# Patient Record
Sex: Female | Born: 1965 | Race: White | Hispanic: No | Marital: Married | Smoking: Never smoker
Health system: Southern US, Community
[De-identification: ages and names within clinical notes are randomized; demographics above are authoritative.]

## PROBLEM LIST (undated history)

## (undated) DIAGNOSIS — G43909 Migraine, unspecified, not intractable, without status migrainosus: Secondary | ICD-10-CM

## (undated) DIAGNOSIS — R42 Dizziness and giddiness: Secondary | ICD-10-CM

## (undated) DIAGNOSIS — E785 Hyperlipidemia, unspecified: Secondary | ICD-10-CM

## (undated) DIAGNOSIS — F419 Anxiety disorder, unspecified: Secondary | ICD-10-CM

## (undated) DIAGNOSIS — R002 Palpitations: Secondary | ICD-10-CM

## (undated) DIAGNOSIS — R634 Abnormal weight loss: Secondary | ICD-10-CM

## (undated) DIAGNOSIS — Z90710 Acquired absence of both cervix and uterus: Secondary | ICD-10-CM

## (undated) DIAGNOSIS — I10 Essential (primary) hypertension: Secondary | ICD-10-CM

## (undated) DIAGNOSIS — R000 Tachycardia, unspecified: Secondary | ICD-10-CM

## (undated) DIAGNOSIS — K620 Anal polyp: Secondary | ICD-10-CM

## (undated) DIAGNOSIS — K621 Rectal polyp: Secondary | ICD-10-CM

## (undated) DIAGNOSIS — K219 Gastro-esophageal reflux disease without esophagitis: Secondary | ICD-10-CM

## (undated) HISTORY — DX: Rectal polyp: K62.1

## (undated) HISTORY — DX: Palpitations: R00.2

## (undated) HISTORY — DX: Essential (primary) hypertension: I10

## (undated) HISTORY — DX: Acquired absence of both cervix and uterus: Z90.710

## (undated) HISTORY — DX: Dizziness and giddiness: R42

## (undated) HISTORY — DX: Anxiety disorder, unspecified: F41.9

## (undated) HISTORY — DX: Migraine, unspecified, not intractable, without status migrainosus: G43.909

## (undated) HISTORY — DX: Tachycardia, unspecified: R00.0

## (undated) HISTORY — DX: Anal polyp: K62.0

## (undated) HISTORY — DX: Gastro-esophageal reflux disease without esophagitis: K21.9

## (undated) HISTORY — DX: Abnormal weight loss: R63.4

## (undated) HISTORY — DX: Hyperlipidemia, unspecified: E78.5

---

## 1989-12-29 HISTORY — PX: HERNIA REPAIR: SHX51

## 1989-12-29 HISTORY — PX: OOPHORECTOMY: SHX86

## 2000-12-29 HISTORY — PX: PERINEAL BODY REPAIR: SHX2216

## 2001-10-29 HISTORY — PX: VAGINAL HYSTERECTOMY: SHX2639

## 2003-12-30 HISTORY — PX: INGUINAL HERNIA REPAIR: SHX194

## 2006-03-18 ENCOUNTER — Encounter: Payer: Self-pay | Admitting: Family Medicine

## 2006-08-17 ENCOUNTER — Ambulatory Visit: Payer: Self-pay | Admitting: Family Medicine

## 2006-08-24 ENCOUNTER — Ambulatory Visit: Payer: Self-pay | Admitting: Family Medicine

## 2006-09-04 ENCOUNTER — Encounter: Payer: Self-pay | Admitting: Cardiology

## 2006-09-04 ENCOUNTER — Ambulatory Visit: Payer: Self-pay

## 2007-02-01 ENCOUNTER — Ambulatory Visit: Payer: Self-pay | Admitting: Family Medicine

## 2007-02-15 ENCOUNTER — Ambulatory Visit: Payer: Self-pay | Admitting: Family Medicine

## 2007-02-23 ENCOUNTER — Ambulatory Visit: Payer: Self-pay | Admitting: Family Medicine

## 2007-02-23 LAB — CONVERTED CEMR LAB: Glucose, Fasting: 108 mg/dL — ABNORMAL HIGH (ref 70–99)

## 2007-02-25 ENCOUNTER — Ambulatory Visit: Payer: Self-pay | Admitting: Family Medicine

## 2007-03-05 ENCOUNTER — Encounter: Admission: RE | Admit: 2007-03-05 | Discharge: 2007-06-03 | Payer: Self-pay | Admitting: Family Medicine

## 2007-06-02 ENCOUNTER — Ambulatory Visit: Payer: Self-pay | Admitting: Family Medicine

## 2007-06-02 LAB — CONVERTED CEMR LAB
Cholesterol: 176 mg/dL (ref 0–200)
HDL: 31.3 mg/dL — ABNORMAL LOW (ref >39.0)
LDL Cholesterol: 132 mg/dL — ABNORMAL HIGH (ref 0–99)
Total CHOL/HDL Ratio: 5.6
VLDL: 12 mg/dL (ref 0–40)

## 2007-06-18 ENCOUNTER — Ambulatory Visit: Payer: Self-pay | Admitting: Family Medicine

## 2007-06-18 DIAGNOSIS — N816 Rectocele: Secondary | ICD-10-CM

## 2007-06-18 DIAGNOSIS — R002 Palpitations: Secondary | ICD-10-CM

## 2007-06-18 DIAGNOSIS — N8112 Cystocele, lateral: Secondary | ICD-10-CM | POA: Insufficient documentation

## 2007-06-18 DIAGNOSIS — G43909 Migraine, unspecified, not intractable, without status migrainosus: Secondary | ICD-10-CM | POA: Insufficient documentation

## 2007-06-18 DIAGNOSIS — N814 Uterovaginal prolapse, unspecified: Secondary | ICD-10-CM | POA: Insufficient documentation

## 2007-07-05 ENCOUNTER — Ambulatory Visit: Payer: Self-pay | Admitting: Family Medicine

## 2007-07-22 ENCOUNTER — Telehealth: Payer: Self-pay | Admitting: Family Medicine

## 2007-07-26 ENCOUNTER — Telehealth (INDEPENDENT_AMBULATORY_CARE_PROVIDER_SITE_OTHER): Payer: Self-pay | Admitting: *Deleted

## 2007-08-05 ENCOUNTER — Telehealth (INDEPENDENT_AMBULATORY_CARE_PROVIDER_SITE_OTHER): Payer: Self-pay | Admitting: *Deleted

## 2007-08-09 ENCOUNTER — Ambulatory Visit: Payer: Self-pay | Admitting: Family Medicine

## 2007-08-09 DIAGNOSIS — H9319 Tinnitus, unspecified ear: Secondary | ICD-10-CM | POA: Insufficient documentation

## 2007-09-22 ENCOUNTER — Ambulatory Visit: Payer: Self-pay | Admitting: Family Medicine

## 2007-09-22 LAB — CONVERTED CEMR LAB
Bilirubin Urine: NEGATIVE
Blood in Urine, dipstick: NEGATIVE
Ketones, urine, test strip: NEGATIVE
Nitrite: NEGATIVE
Protein, U semiquant: NEGATIVE
Urobilinogen, UA: 0.2

## 2007-09-24 LAB — CONVERTED CEMR LAB
ALT: 25 units/L (ref 0–35)
Albumin: 3.9 g/dL (ref 3.5–5.2)
Alkaline Phosphatase: 51 units/L (ref 39–117)
BUN: 15 mg/dL (ref 6–23)
Basophils Absolute: 0 10*3/uL (ref 0.0–0.1)
Calcium: 9.6 mg/dL (ref 8.4–10.5)
Cholesterol: 147 mg/dL (ref 0–200)
Creatinine, Ser: 0.8 mg/dL (ref 0.4–1.2)
GFR calc Af Amer: 102 mL/min
HDL: 33.8 mg/dL — ABNORMAL LOW (ref >39.0)
Hemoglobin: 13.2 g/dL (ref 12.0–15.0)
LDL Cholesterol: 104 mg/dL — ABNORMAL HIGH (ref 0–99)
MCHC: 33.7 g/dL (ref 30.0–36.0)
Microalb Creat Ratio: 4.7 mg/g (ref 0.0–30.0)
Microalb, Ur: 0.3 mg/dL (ref 0.0–1.9)
Monocytes Absolute: 0.4 10*3/uL (ref 0.2–0.7)
Monocytes Relative: 6.9 % (ref 3.0–11.0)
Platelets: 269 10*3/uL (ref 150–400)
Potassium: 4.9 meq/L (ref 3.5–5.1)
RDW: 12.9 % (ref 11.5–14.6)
TSH: 1.62 microintl units/mL (ref 0.35–5.50)
Total Bilirubin: 0.6 mg/dL (ref 0.3–1.2)
Total CHOL/HDL Ratio: 4.3
Triglycerides: 46 mg/dL (ref 0–149)
VLDL: 9 mg/dL (ref 0–40)

## 2007-10-06 ENCOUNTER — Telehealth: Payer: Self-pay | Admitting: Family Medicine

## 2007-10-07 ENCOUNTER — Telehealth: Payer: Self-pay | Admitting: Family Medicine

## 2008-01-03 ENCOUNTER — Telehealth: Payer: Self-pay | Admitting: Family Medicine

## 2008-01-05 ENCOUNTER — Ambulatory Visit: Payer: Self-pay | Admitting: Family Medicine

## 2008-01-05 DIAGNOSIS — E785 Hyperlipidemia, unspecified: Secondary | ICD-10-CM | POA: Insufficient documentation

## 2008-01-05 DIAGNOSIS — R51 Headache: Secondary | ICD-10-CM

## 2008-01-05 DIAGNOSIS — F411 Generalized anxiety disorder: Secondary | ICD-10-CM | POA: Insufficient documentation

## 2008-01-05 DIAGNOSIS — R519 Headache, unspecified: Secondary | ICD-10-CM | POA: Insufficient documentation

## 2008-01-05 DIAGNOSIS — I10 Essential (primary) hypertension: Secondary | ICD-10-CM | POA: Insufficient documentation

## 2008-01-05 DIAGNOSIS — R42 Dizziness and giddiness: Secondary | ICD-10-CM

## 2008-01-05 DIAGNOSIS — E119 Type 2 diabetes mellitus without complications: Secondary | ICD-10-CM

## 2008-01-07 ENCOUNTER — Telehealth: Payer: Self-pay | Admitting: Family Medicine

## 2008-01-07 LAB — CONVERTED CEMR LAB
Albumin: 4.2 g/dL (ref 3.5–5.2)
Basophils Absolute: 0 10*3/uL (ref 0.0–0.1)
Cholesterol: 149 mg/dL (ref 0–200)
Eosinophils Absolute: 0.1 10*3/uL (ref 0.0–0.6)
GFR calc non Af Amer: 84 mL/min
HCT: 39.5 % (ref 36.0–46.0)
Hemoglobin: 14.1 g/dL (ref 12.0–15.0)
Hgb A1c MFr Bld: 5.9 % (ref 4.6–6.0)
MCHC: 35.6 g/dL (ref 30.0–36.0)
MCV: 84.7 fL (ref 78.0–100.0)
Monocytes Absolute: 0.4 10*3/uL (ref 0.2–0.7)
Neutro Abs: 3.7 10*3/uL (ref 1.4–7.7)
Neutrophils Relative %: 63.1 % (ref 43.0–77.0)
Potassium: 4.8 meq/L (ref 3.5–5.1)
Sodium: 140 meq/L (ref 135–145)
TSH: 1.82 microintl units/mL (ref 0.35–5.50)
Total Bilirubin: 0.8 mg/dL (ref 0.3–1.2)

## 2008-01-13 ENCOUNTER — Emergency Department (HOSPITAL_COMMUNITY): Admission: EM | Admit: 2008-01-13 | Discharge: 2008-01-13 | Payer: Self-pay | Admitting: Emergency Medicine

## 2008-01-14 ENCOUNTER — Ambulatory Visit: Payer: Self-pay | Admitting: Family Medicine

## 2008-01-14 DIAGNOSIS — R000 Tachycardia, unspecified: Secondary | ICD-10-CM | POA: Insufficient documentation

## 2008-01-26 ENCOUNTER — Ambulatory Visit: Payer: Self-pay | Admitting: Cardiovascular Disease

## 2008-01-28 ENCOUNTER — Ambulatory Visit: Payer: Self-pay | Admitting: Cardiovascular Disease

## 2008-03-08 ENCOUNTER — Ambulatory Visit: Payer: Self-pay | Admitting: Cardiovascular Disease

## 2008-03-14 ENCOUNTER — Ambulatory Visit: Payer: Self-pay | Admitting: Family Medicine

## 2008-06-05 ENCOUNTER — Ambulatory Visit: Payer: Self-pay | Admitting: Cardiovascular Disease

## 2008-06-05 ENCOUNTER — Telehealth: Payer: Self-pay | Admitting: Family Medicine

## 2008-06-07 ENCOUNTER — Ambulatory Visit: Payer: Self-pay | Admitting: Family Medicine

## 2008-06-08 LAB — CONVERTED CEMR LAB
Albumin: 3.9 g/dL (ref 3.5–5.2)
BUN: 18 mg/dL (ref 6–23)
Basophils Relative: 0.9 % (ref 0.0–1.0)
Calcium: 9.4 mg/dL (ref 8.4–10.5)
Creatinine, Ser: 0.8 mg/dL (ref 0.4–1.2)
Creatinine,U: 54.9 mg/dL
Eosinophils Absolute: 0.1 10*3/uL (ref 0.0–0.7)
Eosinophils Relative: 1.8 % (ref 0.0–5.0)
GFR calc Af Amer: 101 mL/min
GFR calc non Af Amer: 84 mL/min
HCT: 39.8 % (ref 36.0–46.0)
HDL: 37.7 mg/dL — ABNORMAL LOW (ref >39.0)
Hgb A1c MFr Bld: 5.9 % (ref 4.6–6.0)
MCV: 88.3 fL (ref 78.0–100.0)
Microalb Creat Ratio: 3.6 mg/g (ref 0.0–30.0)
Microalb, Ur: 0.2 mg/dL (ref 0.0–1.9)
Monocytes Absolute: 0.3 10*3/uL (ref 0.1–1.0)
Platelets: 240 10*3/uL (ref 150–400)
Potassium: 4.1 meq/L (ref 3.5–5.1)
WBC: 5.1 10*3/uL (ref 4.5–10.5)

## 2008-12-18 ENCOUNTER — Telehealth: Payer: Self-pay | Admitting: Family Medicine

## 2009-01-09 ENCOUNTER — Telehealth: Payer: Self-pay | Admitting: Family Medicine

## 2009-01-13 ENCOUNTER — Emergency Department (HOSPITAL_BASED_OUTPATIENT_CLINIC_OR_DEPARTMENT_OTHER): Admission: EM | Admit: 2009-01-13 | Discharge: 2009-01-14 | Payer: Self-pay | Admitting: Emergency Medicine

## 2009-01-14 ENCOUNTER — Ambulatory Visit: Payer: Self-pay | Admitting: Diagnostic Radiology

## 2009-01-15 ENCOUNTER — Ambulatory Visit: Payer: Self-pay | Admitting: Family Medicine

## 2009-01-17 ENCOUNTER — Telehealth: Payer: Self-pay | Admitting: Family Medicine

## 2009-01-17 LAB — CONVERTED CEMR LAB
AST: 19 units/L (ref 0–37)
Alkaline Phosphatase: 45 units/L (ref 39–117)
Total Bilirubin: 0.7 mg/dL (ref 0.3–1.2)
Total CHOL/HDL Ratio: 3.6

## 2009-02-14 ENCOUNTER — Telehealth: Payer: Self-pay | Admitting: Family Medicine

## 2009-06-05 ENCOUNTER — Telehealth: Payer: Self-pay | Admitting: Cardiovascular Disease

## 2009-06-22 ENCOUNTER — Emergency Department (HOSPITAL_BASED_OUTPATIENT_CLINIC_OR_DEPARTMENT_OTHER): Admission: EM | Admit: 2009-06-22 | Discharge: 2009-06-22 | Payer: Self-pay | Admitting: Emergency Medicine

## 2009-06-24 IMAGING — CT CT HEAD W/O CM
1 series · 16 of 30 positions shown, 20 images · non-contrast
Comparison: None

CLINICAL DATA: Weakness and lightheadedness.

CT HEAD WITHOUT CONTRAST
TECHNIQUE: Contiguous axial images were obtained from the base of
the skull through the vertex without contrast.

[Series 2: head 4.8 h37s · axial · 0.40mm/px · z∈[-165,-32]mm · 16 of 32 slices shown, 20 images]
[im 2/32  brain]
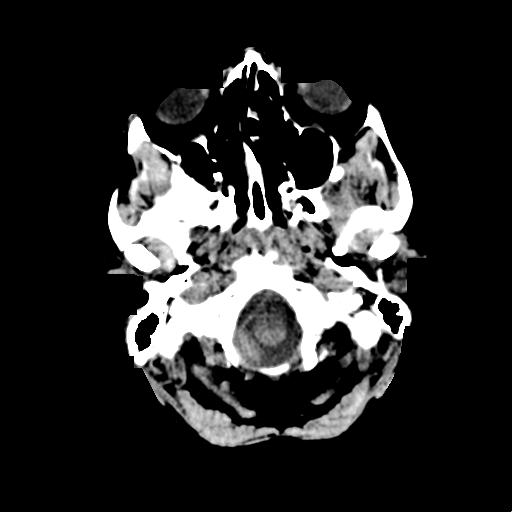
[im 2/32  bone]
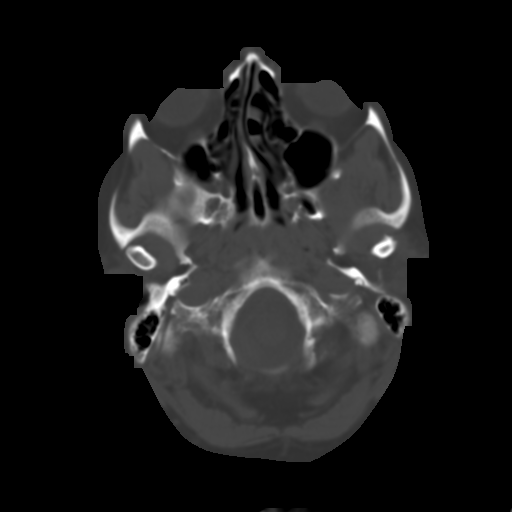
[im 4/32  brain]
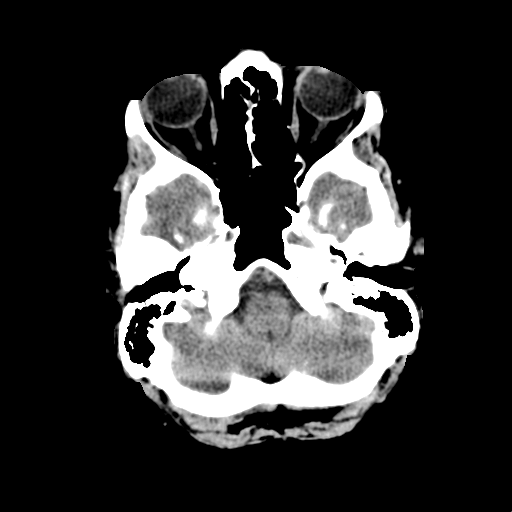
[im 6/32  brain]
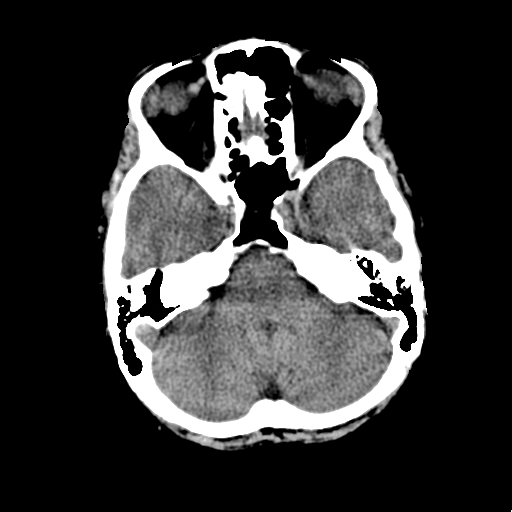
[im 8/32  brain]
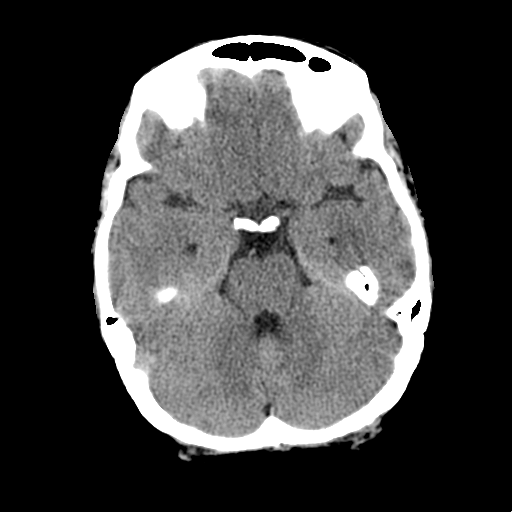
[im 9/32  brain]
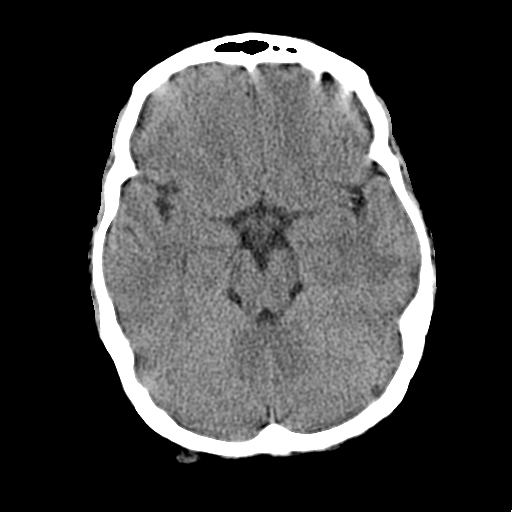
[im 9/32  bone]
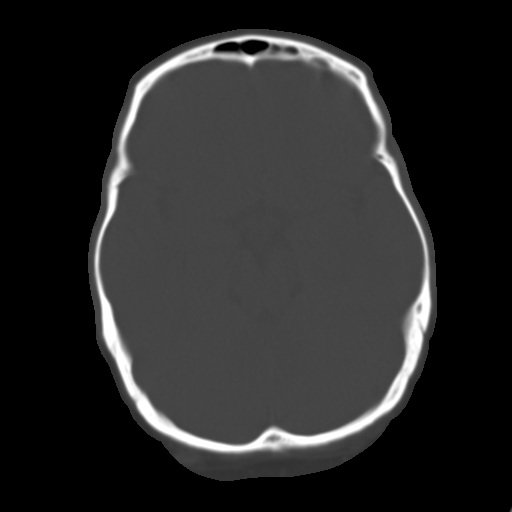
[im 11/32  brain]
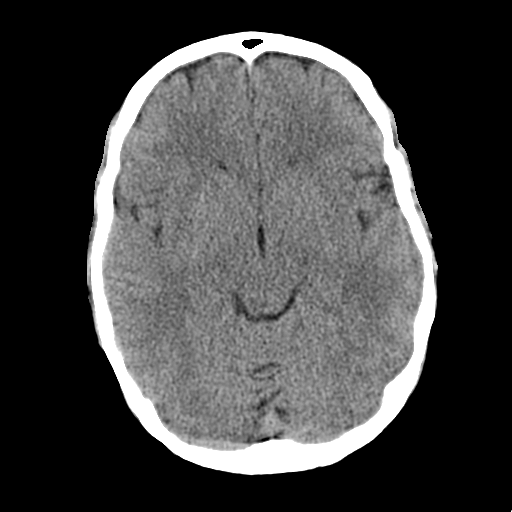
[im 13/32  brain]
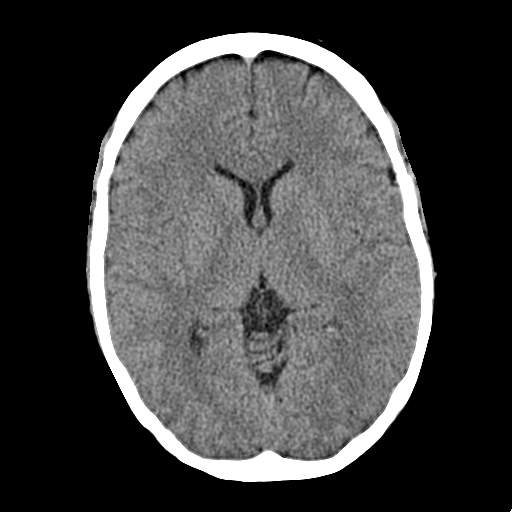
[im 15/32  brain]
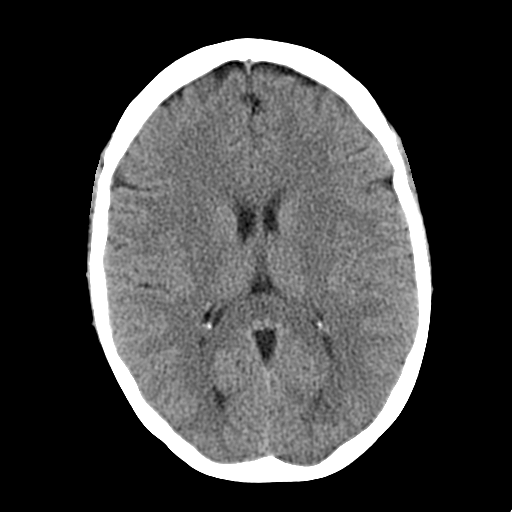
[im 17/32  brain]
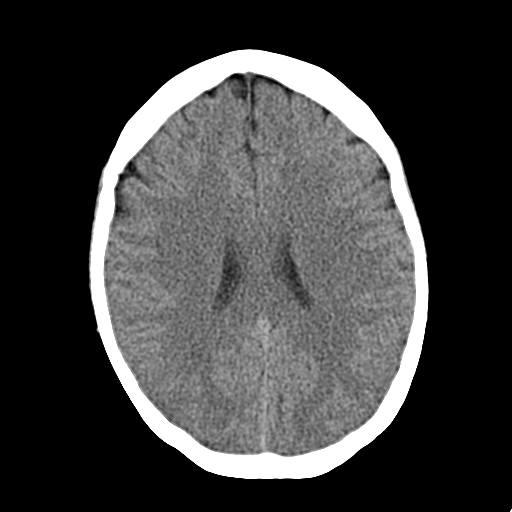
[im 17/32  bone]
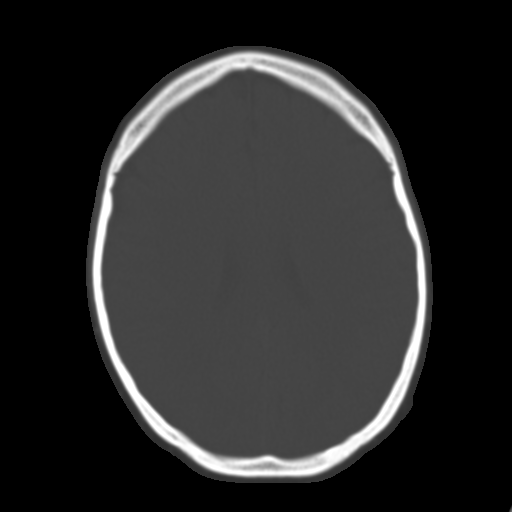
[im 19/32  brain]
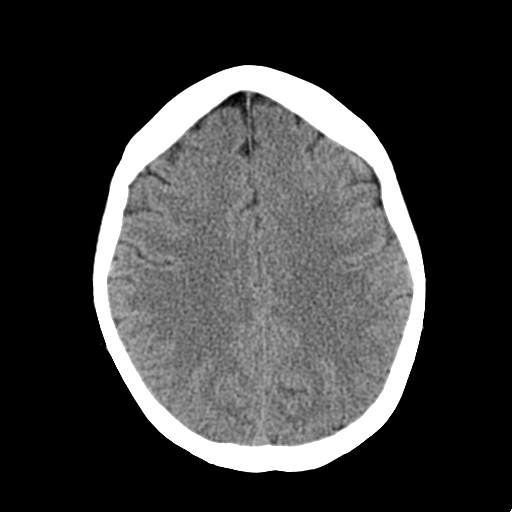
[im 21/32  brain]
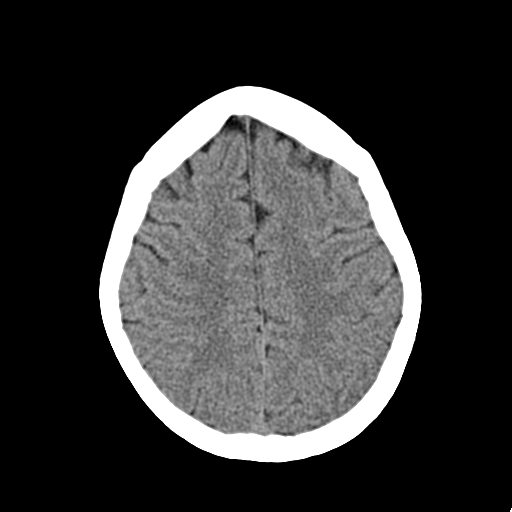
[im 23/32  brain]
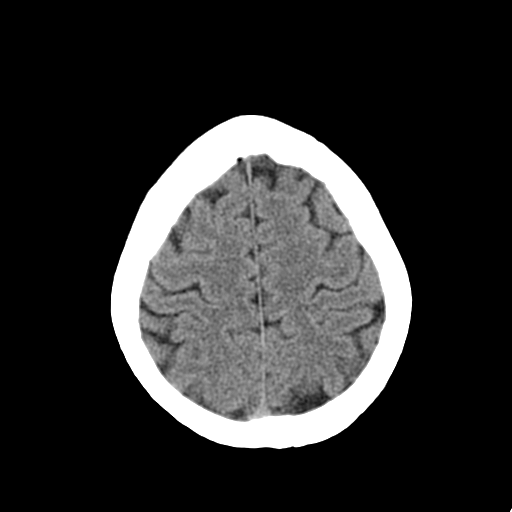
[im 24/32  brain]
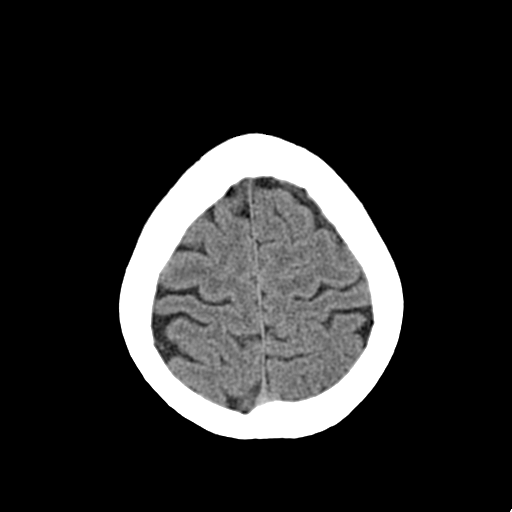
[im 24/32  bone]
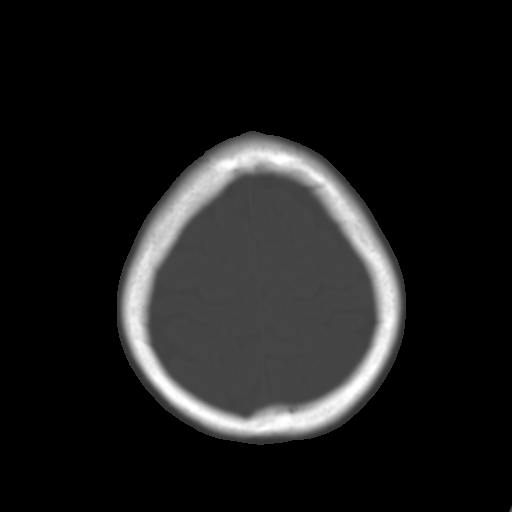
[im 26/32  brain]
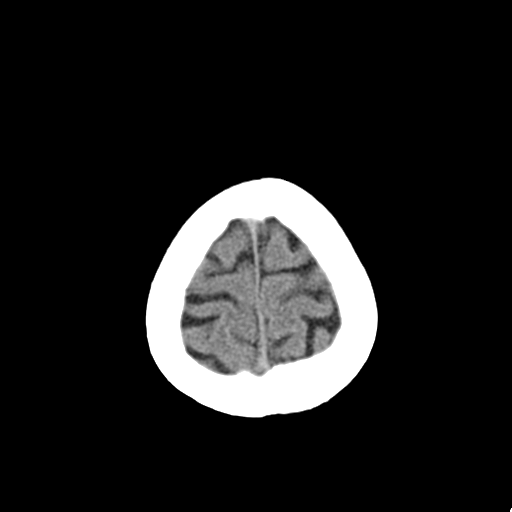
[im 28/32  brain]
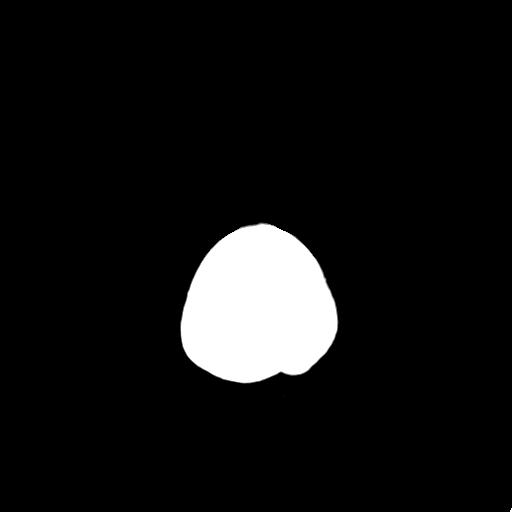
[im 30/32  brain]
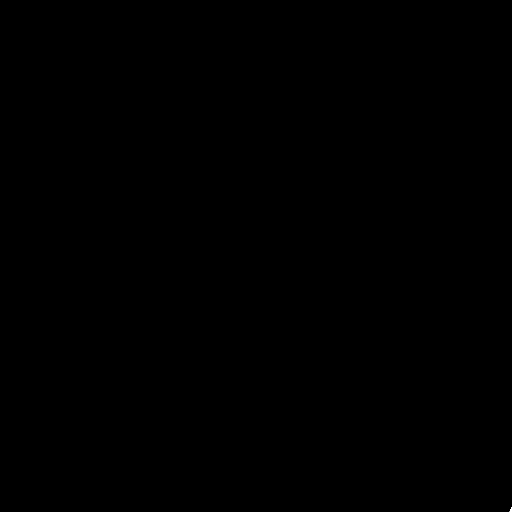

[16 of 30 positions shown; findings below may reference images not displayed]

FINDINGS: There is no evidence of acute intracranial hemorrhage,
mass lesion, brain edema or extra-axial fluid collection.  The
ventricles and subarachnoid spaces are appropriately sized for age.
There is no CT evidence of acute infarction.

The visualized paranasal sinuses are clear.  There is a concha
bullosa on the left.  The calvarium is intact.
IMPRESSION: No acute intracranial findings.

## 2009-06-24 IMAGING — CR DG CHEST 2V
2 series · 2 of 2 positions shown · non-contrast
Comparison: None

CLINICAL DATA: Dizziness and cough.

CHEST - 2 VIEW

[w chest pa]
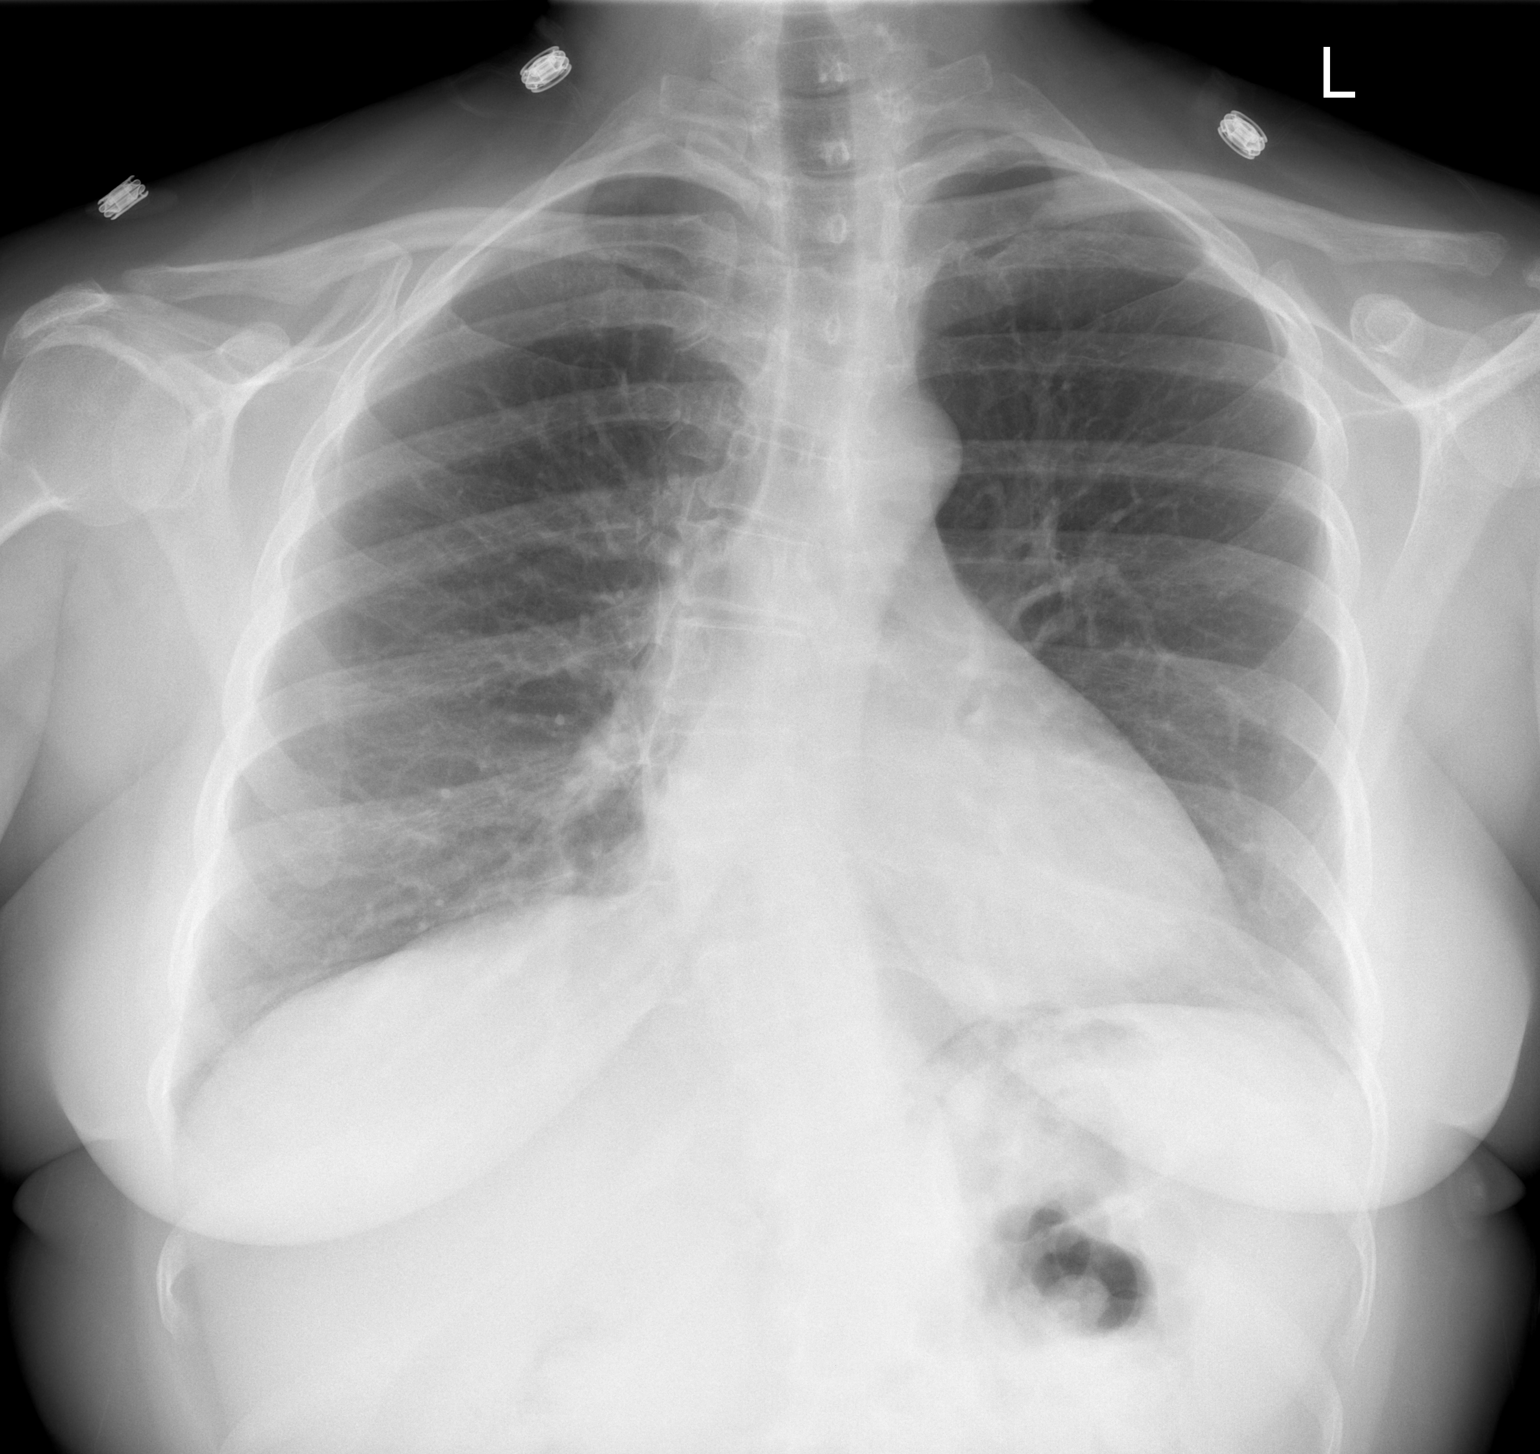

[w chest lat]
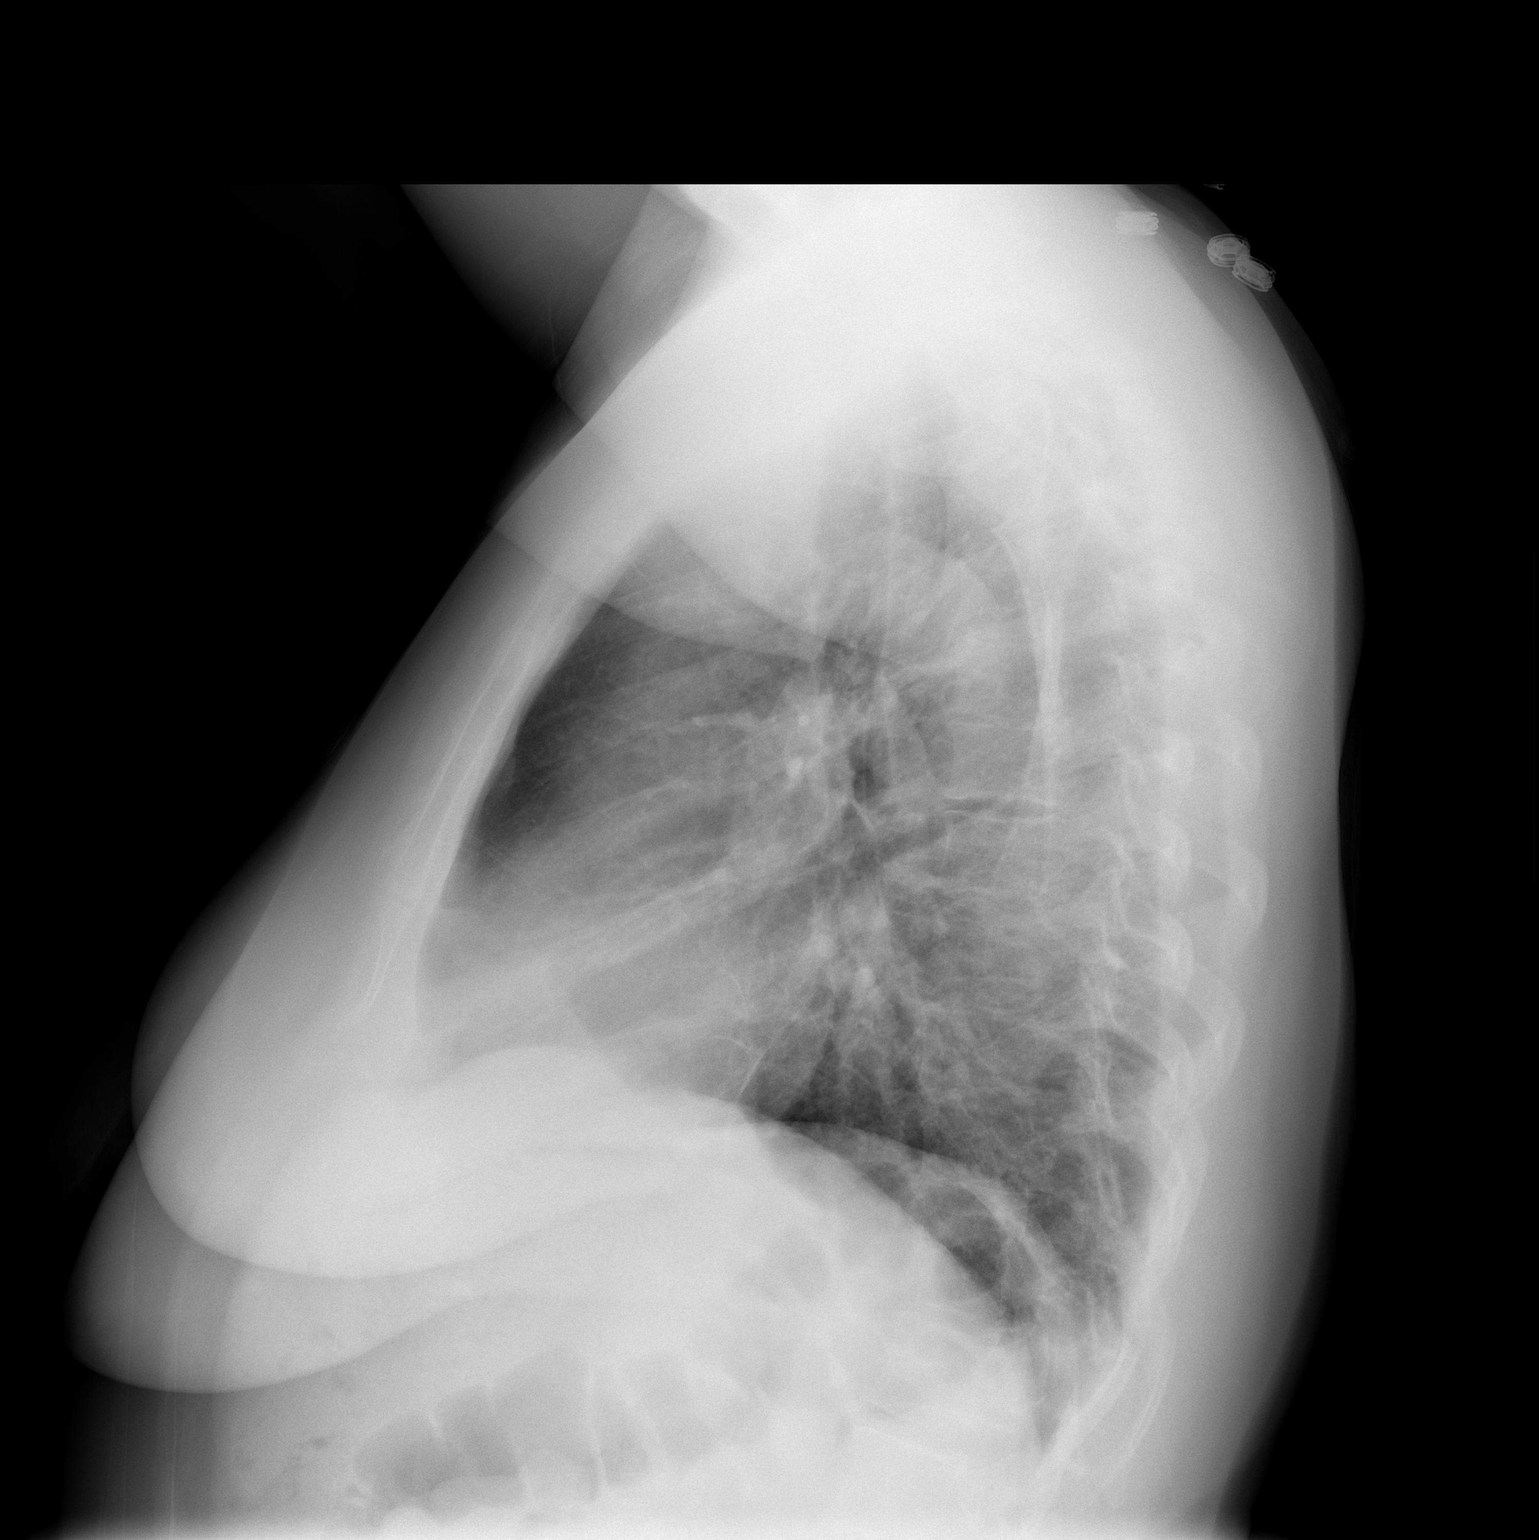

[2 of 2 positions shown; findings below may reference images not displayed]

FINDINGS: There is a convex right thoracic scoliosis.  Allowing for
this, the heart size and mediastinal contours are normal.  The
lungs are clear.  There is no pleural effusion or pneumothorax.
IMPRESSION: No active cardiopulmonary process.  Scoliosis.

## 2009-08-23 ENCOUNTER — Telehealth: Payer: Self-pay | Admitting: Family Medicine

## 2009-10-04 ENCOUNTER — Ambulatory Visit: Payer: Self-pay | Admitting: Family Medicine

## 2009-10-10 LAB — CONVERTED CEMR LAB
AST: 13 units/L (ref 0–37)
Albumin: 3.9 g/dL (ref 3.5–5.2)
Alkaline Phosphatase: 50 units/L (ref 39–117)
Basophils Absolute: 0 10*3/uL (ref 0.0–0.1)
Bilirubin, Direct: 0.1 mg/dL (ref 0.0–0.3)
Calcium: 9.3 mg/dL (ref 8.4–10.5)
Creatinine, Ser: 0.8 mg/dL (ref 0.4–1.2)
Eosinophils Absolute: 0.1 10*3/uL (ref 0.0–0.7)
GFR calc non Af Amer: 83.04 mL/min (ref >60)
Glucose, Bld: 96 mg/dL (ref 70–99)
HDL: 36 mg/dL — ABNORMAL LOW (ref >39.00)
Hemoglobin: 14.2 g/dL (ref 12.0–15.0)
Ketones, ur: NEGATIVE mg/dL
Lymphocytes Relative: 26.2 % (ref 12.0–46.0)
Monocytes Relative: 7.6 % (ref 3.0–12.0)
Neutro Abs: 4.2 10*3/uL (ref 1.4–7.7)
Neutrophils Relative %: 64.1 % (ref 43.0–77.0)
RDW: 13.1 % (ref 11.5–14.6)
Sodium: 135 meq/L (ref 135–145)
Specific Gravity, Urine: 1.02 (ref 1.000–1.030)
Total Bilirubin: 0.6 mg/dL (ref 0.3–1.2)
Total CHOL/HDL Ratio: 3
Total Protein, Urine: NEGATIVE mg/dL
Triglycerides: 45 mg/dL (ref 0.0–149.0)
Urine Glucose: NEGATIVE mg/dL
VLDL: 9 mg/dL (ref 0.0–40.0)
pH: 5.5 (ref 5.0–8.0)

## 2009-10-12 LAB — CONVERTED CEMR LAB: Hgb A1c MFr Bld: 5.8 % (ref 4.6–6.5)

## 2009-10-15 ENCOUNTER — Ambulatory Visit: Payer: Self-pay | Admitting: Family Medicine

## 2009-10-15 DIAGNOSIS — K620 Anal polyp: Secondary | ICD-10-CM | POA: Insufficient documentation

## 2009-10-15 DIAGNOSIS — R634 Abnormal weight loss: Secondary | ICD-10-CM

## 2009-10-15 DIAGNOSIS — K621 Rectal polyp: Secondary | ICD-10-CM

## 2009-10-15 DIAGNOSIS — K6289 Other specified diseases of anus and rectum: Secondary | ICD-10-CM

## 2009-10-17 ENCOUNTER — Telehealth: Payer: Self-pay | Admitting: *Deleted

## 2009-11-19 ENCOUNTER — Encounter: Admission: RE | Admit: 2009-11-19 | Discharge: 2009-12-26 | Payer: Self-pay | Admitting: Family Medicine

## 2009-11-19 ENCOUNTER — Encounter: Payer: Self-pay | Admitting: Family Medicine

## 2009-11-20 ENCOUNTER — Ambulatory Visit: Payer: Self-pay | Admitting: Gastroenterology

## 2009-11-30 ENCOUNTER — Ambulatory Visit: Payer: Self-pay | Admitting: Gastroenterology

## 2009-12-31 ENCOUNTER — Telehealth: Payer: Self-pay | Admitting: Family Medicine

## 2010-01-22 ENCOUNTER — Telehealth: Payer: Self-pay | Admitting: *Deleted

## 2010-03-28 ENCOUNTER — Telehealth: Payer: Self-pay | Admitting: Family Medicine

## 2010-06-10 ENCOUNTER — Telehealth: Payer: Self-pay | Admitting: Family Medicine

## 2010-06-19 ENCOUNTER — Ambulatory Visit: Payer: Self-pay | Admitting: Family Medicine

## 2010-06-19 DIAGNOSIS — M779 Enthesopathy, unspecified: Secondary | ICD-10-CM | POA: Insufficient documentation

## 2010-07-22 ENCOUNTER — Encounter: Admission: RE | Admit: 2010-07-22 | Discharge: 2010-07-22 | Payer: Self-pay | Admitting: Family Medicine

## 2010-08-19 ENCOUNTER — Ambulatory Visit: Payer: Self-pay | Admitting: Family Medicine

## 2010-08-19 DIAGNOSIS — M722 Plantar fascial fibromatosis: Secondary | ICD-10-CM

## 2010-08-22 LAB — CONVERTED CEMR LAB
Hgb A1c MFr Bld: 6 % (ref 4.6–6.5)
TSH: 1.64 microintl units/mL (ref 0.35–5.50)

## 2010-08-28 ENCOUNTER — Encounter: Payer: Self-pay | Admitting: Family Medicine

## 2010-10-01 ENCOUNTER — Encounter: Payer: Self-pay | Admitting: Family Medicine

## 2010-10-18 ENCOUNTER — Telehealth: Payer: Self-pay | Admitting: Family Medicine

## 2011-01-20 DIAGNOSIS — Z8679 Personal history of other diseases of the circulatory system: Secondary | ICD-10-CM | POA: Insufficient documentation

## 2011-01-20 DIAGNOSIS — K5289 Other specified noninfective gastroenteritis and colitis: Secondary | ICD-10-CM | POA: Insufficient documentation

## 2011-01-20 DIAGNOSIS — H8090 Unspecified otosclerosis, unspecified ear: Secondary | ICD-10-CM | POA: Insufficient documentation

## 2011-01-20 DIAGNOSIS — K219 Gastro-esophageal reflux disease without esophagitis: Secondary | ICD-10-CM | POA: Insufficient documentation

## 2011-01-20 DIAGNOSIS — E7800 Pure hypercholesterolemia, unspecified: Secondary | ICD-10-CM | POA: Insufficient documentation

## 2011-01-22 ENCOUNTER — Encounter: Payer: Self-pay | Admitting: Cardiovascular Disease

## 2011-01-22 ENCOUNTER — Ambulatory Visit
Admission: RE | Admit: 2011-01-22 | Discharge: 2011-01-22 | Payer: Self-pay | Source: Home / Self Care | Attending: Cardiovascular Disease | Admitting: Cardiovascular Disease

## 2011-01-23 ENCOUNTER — Emergency Department (HOSPITAL_COMMUNITY)
Admission: EM | Admit: 2011-01-23 | Discharge: 2011-01-23 | Payer: Self-pay | Source: Home / Self Care | Admitting: Emergency Medicine

## 2011-01-23 LAB — POCT CARDIAC MARKERS
Myoglobin, poc: 36.9 ng/mL (ref 12–200)
Myoglobin, poc: 35.6 ng/mL (ref 12–200)
Troponin i, poc: <0.05 ng/mL (ref 0.00–0.09)

## 2011-01-23 LAB — CBC
MCH: 28.3 pg (ref 26.0–34.0)
MCV: 86.6 fL (ref 78.0–100.0)
Platelets: 270 10*3/uL (ref 150–400)
RBC: 4.63 MIL/uL (ref 3.87–5.11)
RDW: 13.7 % (ref 11.5–15.5)
WBC: 7.7 10*3/uL (ref 4.0–10.5)

## 2011-01-23 LAB — BASIC METABOLIC PANEL
BUN: 19 mg/dL (ref 6–23)
Chloride: 107 mEq/L (ref 96–112)
Creatinine, Ser: 0.68 mg/dL (ref 0.4–1.2)
GFR calc Af Amer: >60 mL/min (ref >60)
GFR calc non Af Amer: >60 mL/min (ref >60)
Potassium: 4.3 mEq/L (ref 3.5–5.1)

## 2011-01-23 LAB — DIFFERENTIAL
Basophils Relative: 0 % (ref 0–1)
Eosinophils Absolute: 0.1 10*3/uL (ref 0.0–0.7)
Eosinophils Relative: 1 % (ref 0–5)
Neutrophils Relative %: 64 % (ref 43–77)

## 2011-01-27 ENCOUNTER — Telehealth: Payer: Self-pay | Admitting: Cardiovascular Disease

## 2011-01-28 NOTE — Progress Notes (Signed)
Summary: wants ref  Phone Note Call from Patient Call back at Home Phone [phone]   Caller: Patient Call For: Nelwyn Salisbury MD Summary of Call: wants referral to gyn @[facility]  for rectocele, Dr Hazle Coca who she has seen before. Initial call taken by: Raechel Ache, RN,  March 28, 2010 9:17 AM  Follow-up for Phone Call        please do this referral Follow-up by: Nelwyn Salisbury MD,  March 28, 2010 11:39 AM

## 2011-01-28 NOTE — Progress Notes (Signed)
Summary: d/c simvastatin  Phone Note Call from Patient Call back at Work Phone [phone] Call back at [phone] (cell)   Caller: Patient Summary of Call: Pt would like to d/c zocor. She wants to know what to do to wean off this medication and what can she take that is natural ie Fish Oil. Pt aware that Dr. Clent Ridges is out until Wed. Initial call taken by: Romualdo Bolk, CMA (AAMA),  June 10, 2010 9:00 AM  Follow-up for Phone Call        no need to wean off, just stop it. May use fish oil and red yeast rice extract OTC. We should check lipids in 6 months Follow-up by: Nelwyn Salisbury MD,  June 12, 2010 1:02 PM

## 2011-01-28 NOTE — Progress Notes (Signed)
Summary: next step  Phone Note Call from Patient Call back at Home Phone [phone]   Caller: Patient Call For: Mahamadou Weltz Summary of Call: pt had a normal colonoscopy and she has a hx of colon polyps and wants to know the next step Initial call taken by: Alfred Levins, CMA,  December 31, 2009 5:31 PM  Follow-up for Phone Call        she needs to ask Dr. Arlyce Dice about this Follow-up by: Nelwyn Salisbury MD,  January 02, 2010 3:15 PM  Additional Follow-up for Phone Call Additional follow up Details #1::        Left message on pt's personal voice mail. Additional Follow-up by: Lynann Beaver CMA,  January 02, 2010 4:58 PM

## 2011-01-28 NOTE — Assessment & Plan Note (Signed)
Summary: R FOOT PAIN // RS   Vital Signs:  Patient profile:   45 year old female Weight:      188 pounds BMI:     33.96 BP sitting:   90 / 60  (left arm) Cuff size:   regular  Vitals Entered By: Raechel Ache, RN (June 19, 2010 11:00 AM) CC: Injured R foot in January and recently getting worse after walking on treadmill.   History of Present Illness: Here for several months of intermittent pains in the bottom of the right foot, especially around the heel and into the lateral side of the foot. No redness or swelling. This all started when she was coming down some steps out of an airplane, and she landed awkwardly on the foot. This was tolerable until about 2 weeks ago when it got worse after walking on her treadmill at home. Tylenol does not help.   Allergies (verified): No Known Drug Allergies  Past History:  Past Medical History: Reviewed history from 11/20/2009 and no changes required. Anxiety Diabetes mellitus, type II Headache Hyperlipidemia Rectocele palpitations, sees Dr. Eden Emms otosclerosis, sees Dr. Jac Canavan  Past Surgical History: Reviewed history from 10/15/2009 and no changes required. Oophorectomy 1991 Hysterectomy 2002 for uterine prolapse Anterior & posterior perineal repair 2002 for rectal prolapse Right fermoral hernia repair 1991 Right inguinal hernia repair 2005 left otosclerosis repair 03-2009 per Dr. Jac Canavan  Review of Systems  The patient denies anorexia, fever, weight loss, weight gain, vision loss, decreased hearing, hoarseness, chest pain, syncope, dyspnea on exertion, peripheral edema, prolonged cough, headaches, hemoptysis, abdominal pain, melena, hematochezia, severe indigestion/heartburn, hematuria, incontinence, genital sores, muscle weakness, suspicious skin lesions, transient blindness, difficulty walking, depression, unusual weight change, abnormal bleeding, enlarged lymph nodes, angioedema, breast masses, and testicular masses.    Physical  Exam  General:  walks with a slight limp Extremities:  tender under the right heel, just under the lateral malleolus, and along the lateral edge of the foot. No warmth or swelling or redness. The arch seems intact   Impression & Recommendations:  Problem # 1:  TENDINITIS (ICD-726.90)  Complete Medication List: 1)  Metoprolol Tartrate 25 Mg Tabs (Metoprolol tartrate) .... One in am and 1/2 in pm 2)  Zocor 40 Mg Tabs (Simvastatin) .Marland Kitchen.. 1 by mouth once daily 3)  Prilosec Otc 20 Mg Tbec (Omeprazole magnesium) .Marland Kitchen.. 1 by mouth once daily 4)  Imitrex 100 Mg Tabs (Sumatriptan succinate) .... As needed 5)  Onetouch Ultra Test Strp (Glucose blood) .... Three times a day 6)  Onetouch Ultrasoft Lancets Misc (Lancets) .... Use as directed 7)  Etodolac 500 Mg Tabs (Etodolac) .... Two times a day  Patient Instructions: 1)  Stay off her foot as much as possible for a few weeks. Use Etodolac to reduce inflammation. Wear supportive shoes with good arch supports.  2)  Please schedule a follow-up appointment as needed .  Prescriptions: ETODOLAC 500 MG TABS (ETODOLAC) two times a day  #60 x 5   Entered and Authorized by:   Nelwyn Salisbury MD   Signed by:   Nelwyn Salisbury MD on 06/19/2010   Method used:   Electronically to        [facility] (retail)       [address] [place], [place]  [address]       Ph: [phone]       Fax: [phone]  RxID:   [id number]

## 2011-01-28 NOTE — Progress Notes (Signed)
Summary: advise of otc meds  Phone Note Call from Patient Call back at Home Phone [phone]   Caller: [facility] mail on yesterday @ 4:35 pm Reason for Call: Talk to Nurse Summary of Call: C/o congestion and cough since last Friday. She is on a Beta bloker. What can she take OTC? Initial call taken by: Warnell Forester,  October 18, 2010 8:12 AM  Follow-up for Phone Call        try Delsym Follow-up by: Nelwyn Salisbury MD,  October 18, 2010 1:16 PM  Additional Follow-up for Phone Call Additional follow up Details #1::        pt called Additional Follow-up by: Pura Spice, RN,  October 18, 2010 3:11 PM

## 2011-01-28 NOTE — Progress Notes (Signed)
Summary: traveling this weekend  Phone Note Call from Patient   Caller: Patient Call For: Nelwyn Salisbury MD Summary of Call: Pt is traveling to [place], [place] and would like altitude sickness RX? [phone] Initial call taken by: Lynann Beaver CMA,  January 22, 2010 3:33 PM  Follow-up for Phone Call        will she be mountain climbing or what? Follow-up by: Nelwyn Salisbury MD,  January 23, 2010 9:46 AM  Additional Follow-up for Phone Call Additional follow up Details #1::        LMTCB Additional Follow-up by: Alfred Levins, CMA,  January 23, 2010 11:56 AM     Appended Document: traveling this weekend    Phone Note Call from Patient Call back at [facility] Phone [phone]   Caller: Patient Call For: fry Summary of Call: pt will be climbing a mountain and she will be 10-12,08ft in the air.  She is leaving Sunday Initial call taken by: Cindy Duante Arocho, CMA,  January 23, 2010 1:05 PM  Follow-up for Phone Call        okay, call in Diamox 500 mg two times a day , #60 with no rf Follow-up by: Stephen A Fry MD,  January 23, 2010 1:45 PM  Additional Follow-up for Phone Call Additional follow up Details #1::        Phone Call Completed, Rx Called In Additional Follow-up by: Cindy Lindwood Mogel, CMA,  January 23, 2010 1:46 PM    New/Updated Medications: DIAMOX SEQUELS 500 MG XR12H-CAP (ACETAZOLAMIDE) 1 by mouth two times a day Prescriptions: DIAMOX SEQUELS 500 MG XR12H-CAP (ACETAZOLAMIDE) 1 by mouth two times a day  #60 x 0   Entered by:   Cindy Montina Dorrance, CMA   Authorized by:   Stephen A Fry MD   Signed by:   Cindy Carly Sabo, CMA on 01/23/2010   Method used:   Electronically to        [facility] [address] (retail)       [address] [place].       [place], [place]  [address]       Ph: [phone]       Fax: [phone]   RxID:   [id number]

## 2011-01-28 NOTE — Assessment & Plan Note (Signed)
Summary: EVAL OF R FOOT PAIN // RS   Vital Signs:  Patient profile:   45 year old female Weight:      193 pounds BMI:     34.86 BP sitting:   106 / 80  (left arm) Cuff size:   regular  Vitals Entered By: Raechel Ache, RN (August 19, 2010 11:00 AM) CC: Still has pain R foot- getting worse.   History of Present Illness: She continues to have pains in the arch and heel of the right foot depsite wearing supportive shoes and taking Etodolac. This has been going on for about 3 months.   Allergies: No Known Drug Allergies  Past History:  Past Medical History: Reviewed history from 11/20/2009 and no changes required. Anxiety Diabetes mellitus, type II Headache Hyperlipidemia Rectocele palpitations, sees Dr. Eden Emms otosclerosis, sees Dr. Jac Canavan  Review of Systems  The patient denies anorexia, fever, weight loss, weight gain, vision loss, decreased hearing, hoarseness, chest pain, syncope, dyspnea on exertion, peripheral edema, prolonged cough, headaches, hemoptysis, abdominal pain, melena, hematochezia, severe indigestion/heartburn, hematuria, incontinence, genital sores, muscle weakness, suspicious skin lesions, transient blindness, difficulty walking, depression, unusual weight change, abnormal bleeding, enlarged lymph nodes, angioedema, breast masses, and testicular masses.    Physical Exam  General:  Well-developed,well-nourished,in no acute distress; alert,appropriate and cooperative throughout examination Extremities:  tender over the inferior right heel and arch   Impression & Recommendations:  Problem # 1:  PLANTAR FASCIITIS (ICD-728.71)  The following medications were removed from the medication list:    Etodolac 500 Mg Tabs (Etodolac) .Marland Kitchen..Marland Kitchen Two times a day  Orders: Podiatry Referral (Podiatry)  Problem # 2:  HYPERTENSION (ICD-401.9)  Her updated medication list for this problem includes:    Metoprolol Tartrate 25 Mg Tabs (Metoprolol tartrate) ..... One in am  and 1/2 in pm  Problem # 3:  DIABETES MELLITUS, TYPE II (ICD-250.00)  Orders: Venipuncture (32951) TLB-A1C / Hgb A1C (Glycohemoglobin) (83036-A1C) TLB-TSH (Thyroid Stimulating Hormone) (84443-TSH)  Complete Medication List: 1)  Metoprolol Tartrate 25 Mg Tabs (Metoprolol tartrate) .... One in am and 1/2 in pm 2)  Zocor 40 Mg Tabs (Simvastatin) .Marland Kitchen.. 1 by mouth once daily 3)  Prilosec Otc 20 Mg Tbec (Omeprazole magnesium) .Marland Kitchen.. 1 by mouth once daily 4)  Imitrex 100 Mg Tabs (Sumatriptan succinate) .... As needed 5)  Onetouch Ultra Test Strp (Glucose blood) .... Three times a day 6)  Onetouch Ultrasoft Lancets Misc (Lancets) .... Use as directed 7)  Prednisone (pak) 10 Mg Tabs (Prednisone) .... As directed for 12 days  Patient Instructions: 1)  will refer her to Podiatry. Continue with non-weightbearing exercise like riding a bike or swimming. Try a steroid lose pack Prescriptions: PREDNISONE (PAK) 10 MG TABS (PREDNISONE) as directed for 12 days  #1 x 0   Entered and Authorized by:   Nelwyn Salisbury MD   Signed by:   Nelwyn Salisbury MD on 08/19/2010   Method used:   Electronically to        [facility] [address] (retail)       [address] [place], [place]  [address]       Ph: [phone]       Fax: [phone]   RxID:   [id number]

## 2011-01-28 NOTE — Consult Note (Signed)
Summary: [facility]  [facility]   Imported By: Maryln Gottron 09/17/2010 13:22:11  _____________________________________________________________________  External Attachment:    Type:   Image     Comment:   External Document

## 2011-01-28 NOTE — Letter (Signed)
Summary: No Show for Appt[facility]  No Show for Appt./[facility]   Imported By: Maryln Gottron 10/07/2010 11:14:28  _____________________________________________________________________  External Attachment:    Type:   Image     Comment:   External Document

## 2011-01-30 NOTE — Assessment & Plan Note (Signed)
Summary: f2y.gd   Referring Provider:  Gershon Crane, MD Primary Provider:  Gershon Crane, MD  CC:  45 year check up.  History of Present Illness: Last seen 2009 for PACs palpitations, elevated cholesterol.  On BB and statin.  Has recently stopped stain and wants to try diet Rx has her TC was under 150 on statin.  Discussed Paleo and [employer] as low carb will help with her type 2 DM.  Still with occasional "sinking" feelings and palpitatons but nothing new.  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  Sedentary outside of occasoinal yoga.  Discussed utility of continued BB Rx given migraines and palpitaitons.    Current Problems (verified): 1)  Tachycardia  (ICD-785.0) 2)  Hypertension  (ICD-401.9) 3)  Palpitations, Hx of  (ICD-V12.50) 4)  Palpitations, Occasional  (ICD-785.1) 5)  Gerd  (ICD-530.81) 6)  Hypercholesterolemia  (ICD-272.0) 7)  Otosclerosis  (ICD-387.9) 8)  Gastroenteritis  (ICD-558.9) 9)  Plantar Fasciitis  (ICD-728.71) 10)  Tendinitis  (ICD-726.90) 11)  Weight Loss  (ICD-783.21) 12)  Loss of Weight  (ICD-783.21) 13)  Anal and Rectal Polyp  (ICD-569.0) 14)  Other Specified Disorder of Rectum and Anus  (ICD-569.49) 15)  Headache  (ICD-784.0) 16)  Anxiety  (ICD-300.00) 17)  Vertigo  (ICD-780.4) 18)  Generalized Anxiety Disorder  (ICD-300.02) 19)  Hyperlipidemia  (ICD-272.4) 20)  Diabetes Mellitus, Type II  (ICD-250.00) 21)  Physical Examination  (ICD-V70.0) 22)  Tinnitus  (ICD-388.30) 23)  Migraine Headache  (ICD-346.90) 24)  Uterine Prolapse  (ICD-618.1) 25)  Prolapse, Vaginal Wall, Cystocele, Lateral  (ICD-618.02) 26)  Prolapse, Vaginal Wall, Rectocele  (ICD-618.04)  Current Medications (verified): 1)  Metoprolol Tartrate 25 Mg Tabs (Metoprolol Tartrate) .... One in Am and 1/2 in Pm 2)  Imitrex 100 Mg  Tabs (Sumatriptan Succinate) .... As Needed 3)  Onetouch Ultra Test  Strp (Glucose Blood) .... Three Times A Day 4)  Onetouch Ultrasoft Lancets  Misc  (Lancets) .... Use As Directed 5)  Aspirin 81 Mg Tbec (Aspirin) .... Take One Tablet By Mouth Daily 6)  Co-Enzyme Q-10 30 Mg Caps (Coenzyme Q10) .Marland Kitchen.. 1 Tab By Mouth Once Daily 7)  Vitamin D .... 1 Tab By Mouth Once Daily  Allergies (verified): No Known Drug Allergies  Past History:  Past Medical History: Last updated: 01/20/2011 TACHYCARDIA  long history of palpitations and PAC;s benign monitor 2009 HYPERTENSION PALPITATIONS, HX OF   sees Dr. Eden Emms PALPITATIONS, OCCASIONAL GERD  HYPERCHOLESTEROLEMIA OTOSCLEROSIS, sees Dr. Jac Canavan GASTROENTERITIS PLANTAR FASCIITIS TENDINITIS  WEIGHT LOSS  LOSS OF WEIGHT ANAL AND RECTAL POLYP  OTHER SPECIFIED DISORDER OF RECTUM AND ANUS HEADACHE ANXIETY  VERTIGO  GENERALIZED ANXIETY DISORDER  HYPERLIPIDEMIA DIABETES MELLITUS, TYPE II  PHYSICAL EXAMINATION  TINNITUS MIGRAINE HEADACHE UTERINE PROLAPSE PROLAPSE, VAGINAL WALL, CYSTOCELE, LATERAL  PROLAPSE, VAGINAL WALL, RECTOCELE  Rectocele  Past Surgical History: Last updated: 10/15/2009 Oophorectomy 1991 Hysterectomy 2002 for uterine prolapse Anterior & posterior perineal repair 2002 for rectal prolapse Right fermoral hernia repair 1991 Right inguinal hernia repair 2005 left otosclerosis repair 03-2009 per Dr. Jac Canavan  Family History: Last updated: 11/20/2009 Brother MI 34 Family History Osteoporosis Family History of Prostate CA 1st degree relative <50 Family History of Heart disease Carcinoid Tumors No FH of Colon Cancer: Family History of Colon Polyps:Mother and Father  Family History of Irritable Bowel Syndrome:Son  Family History of Breast Cancer:Maternal Aunt and Cousin   Social History: Last updated: 11/20/2009 Married Research scientist (medical)  2 boys  Never Smoked Alcohol use-no Drug  use-no  Review of Systems       Denies fever, malais, weight loss, blurry vision, decreased visual acuity, cough, sputum, SOB, hemoptysis, pleuritic pain, heartburn, abdominal pain,  melena, lower extremity edema, claudication, or rash.   Vital Signs:  Patient profile:   45 year old female Height:      62 inches Weight:      192 pounds BMI:     35.24 Pulse rate:   86 / minute Resp:     14 per minute BP sitting:   112 / 65  (left arm)  Vitals Entered By: Kem Parkinson (January 22, 2011 3:55 PM)  Physical Exam  General:  Affect appropriate Healthy:  appears stated age HEENT: normal Neck supple with no adenopathy JVP normal no bruits no thyromegaly Lungs clear with no wheezing and good diaphragmatic motion Heart:  S1/S2 no murmur,rub, gallop or click PMI normal Abdomen: benighn, BS positve, no tenderness, no AAA no bruit.  No HSM or HJR Distal pulses intact with no bruits No edema Neuro non-focal Skin warm and dry    Impression & Recommendations:  Problem # 1:  TACHYCARDIA (ICD-785.0) Physiologic  continue low dose BB  Problem # 2:  HYPERTENSION (ICD-401.9) Well contorlled Her updated medication list for this problem includes:    Metoprolol Tartrate 25 Mg Tabs (Metoprolol tartrate) ..... One in am and 1/2 in pm    Aspirin 81 Mg Tbec (Aspirin) .Marland Kitchen... Take one tablet by mouth daily  Problem # 3:  HYPERCHOLESTEROLEMIA (ICD-272.0) F/U primary  Statin stopped on her own  Low carb [employer] The following medications were removed from the medication list:    Zocor 40 Mg Tabs (Simvastatin) .Marland Kitchen... 1 by mouth once daily  Patient Instructions: 1)  Your physician recommends that you continue on your current medications as directed. Please refer to the Current Medication list given to you today. 2)  Your physician wants you to follow-up in: 1 YEAR  You will receive a reminder letter in the mail two months in advance. If you don't receive a letter, please call our office to schedule the follow-up appointment.   EKG Report  Procedure date:  01/22/2011  Findings:      NSR 86 LAE Nonspecific ST/T wave changes

## 2011-02-05 NOTE — Progress Notes (Addendum)
Summary: call regarding a referral  Phone Note Call from Patient Call back at Home Phone [phone]   Caller: Patient Summary of Call: Pt calling regarding getting a referral Initial call taken by: Judie Grieve,  January 27, 2011 8:07 AM  Follow-up for Phone Call        Ms. Peffley stated that Dr. Eden Emms had wanted her to see someone regarding weight loss. She found a Dr. Shanda Howells @ the [facility] and would like to see him. Advised her I would forward this to Dr. Eden Emms. She could prob. set this up as a self referral or we could put in a referral from our office. Whitney Maeola Sarah RN  January 27, 2011 8:39 AM  Follow-up by: Whitney Maeola Sarah RN,  January 27, 2011 8:39 AM     Appended Document: call regarding a referral ok to call and place referral  Appended Document: call regarding a referral LVMTCB if she needs for [place] to put in a referral

## 2011-04-07 LAB — BASIC METABOLIC PANEL
Calcium: 8.8 mg/dL (ref 8.4–10.5)
GFR calc Af Amer: >60 mL/min (ref >60)
GFR calc non Af Amer: >60 mL/min (ref >60)
Glucose, Bld: 106 mg/dL — ABNORMAL HIGH (ref 70–99)
Potassium: 3.5 mEq/L (ref 3.5–5.1)
Sodium: 145 mEq/L (ref 135–145)

## 2011-04-07 LAB — CBC
HCT: 38.4 % (ref 36.0–46.0)
Hemoglobin: 13.1 g/dL (ref 12.0–15.0)
RDW: 13.2 % (ref 11.5–15.5)
WBC: 5.2 10*3/uL (ref 4.0–10.5)

## 2011-04-07 LAB — URINALYSIS, ROUTINE W REFLEX MICROSCOPIC
Bilirubin Urine: NEGATIVE
Glucose, UA: NEGATIVE mg/dL
Nitrite: NEGATIVE
Specific Gravity, Urine: 1.014 (ref 1.005–1.030)
pH: 6 (ref 5.0–8.0)

## 2011-04-07 LAB — DIFFERENTIAL
Basophils Absolute: 0 10*3/uL (ref 0.0–0.1)
Eosinophils Relative: 3 % (ref 0–5)
Lymphocytes Relative: 36 % (ref 12–46)
Lymphs Abs: 1.9 10*3/uL (ref 0.7–4.0)
Monocytes Absolute: 0.4 10*3/uL (ref 0.1–1.0)
Neutro Abs: 2.8 10*3/uL (ref 1.7–7.7)

## 2011-04-14 LAB — POCT CARDIAC MARKERS
CKMB, poc: <1 ng/mL — ABNORMAL LOW (ref 1.0–8.0)
CKMB, poc: <1 ng/mL — ABNORMAL LOW (ref 1.0–8.0)
Troponin i, poc: <0.05 ng/mL (ref 0.00–0.09)

## 2011-04-14 LAB — DIFFERENTIAL
Eosinophils Absolute: 0.2 10*3/uL (ref 0.0–0.7)
Eosinophils Relative: 3 % (ref 0–5)
Lymphocytes Relative: 28 % (ref 12–46)
Lymphs Abs: 2.1 10*3/uL (ref 0.7–4.0)
Monocytes Absolute: 0.6 10*3/uL (ref 0.1–1.0)
Monocytes Relative: 8 % (ref 3–12)

## 2011-04-14 LAB — URINALYSIS, ROUTINE W REFLEX MICROSCOPIC
Bilirubin Urine: NEGATIVE
Glucose, UA: NEGATIVE mg/dL
Hgb urine dipstick: NEGATIVE
Ketones, ur: NEGATIVE mg/dL
pH: 7 (ref 5.0–8.0)

## 2011-04-14 LAB — BASIC METABOLIC PANEL
Chloride: 107 mEq/L (ref 96–112)
GFR calc non Af Amer: >60 mL/min (ref >60)
Potassium: 4.2 mEq/L (ref 3.5–5.1)
Sodium: 139 mEq/L (ref 135–145)

## 2011-04-14 LAB — CBC
HCT: 39.5 % (ref 36.0–46.0)
Hemoglobin: 13.2 g/dL (ref 12.0–15.0)
MCV: 87.9 fL (ref 78.0–100.0)
RBC: 4.5 MIL/uL (ref 3.87–5.11)
WBC: 7.5 10*3/uL (ref 4.0–10.5)

## 2011-05-01 ENCOUNTER — Other Ambulatory Visit: Payer: Self-pay | Admitting: Family Medicine

## 2011-05-13 NOTE — Assessment & Plan Note (Signed)
[facility]                            CARDIOLOGY OFFICE NOTE   Jodi Smith, Jodi Smith                          MRN:          [id number]  DATE:06/05/2008                            DOB:          December 29, 1966    Jodi Smith returns today in followup. I have seen her hypercholesterolemia,  palpitations, and PACs.   The patient also has hypercholesterolemia.   She had an event monitor with over 50 pages of records.  I have reviewed  these at length.  She primarily had sinus arrhythmia and actually very  few premature atrial contractions.  Back in 2007, she had a Holter  monitor with frequent PACs.  She is doing better.  She got laid off from  her work at [employer] and I suspect this has a lot to do with her  palpitations improving now.  There are issues with interactions of  statin drugs and Cardizem and I would prefer her currently to be on a  beta-blocker and simvastatin.   REVIEW OF SYSTEMS:  Remarkable for no significant chest pain,  palpitations, or presyncope.  She also is a borderline diabetic and  follows up with Dr. Clent Smith.  Her hemoglobin A1c was only 5.9.  She is very  in tune to diet and understands the problems with carbs versus protein.   MEDICATIONS:  1. Prilosec 20 a day.  2. Metoprolol 25 a day.  3. Multivitamins.  4. Lipitor.  5. Simvastatin 40 a day.   PHYSICAL EXAMINATION:  VITAL SIGNS:  Her weight is 177, blood pressure  is 130/70, pulse 79 and regular, respiratory 14, and afebrile.  GENERAL:  Affect appropriate.  HEENT:  Unremarkable.  Carotids are without bruits.  No lymphadenopathy,  thyromegaly, and JVP elevation.  LUNGS:  Clear and good diaphragmatic motion.  No wheezing.  HEART:  S1 and S2 with normal heart sounds.  PMI normal.  ABDOMEN:  Benign.  Bowel sounds positive.  No AAA.  No tenderness.  No  hepatosplenomegaly.  No hepatojugular reflux.  No tenderness or bruit.  EXTREMITIES:  Distal pulses intact.  No edema.  No  muscular weakness.  NEUROLOGICAL:  Nonfocal.  SKIN:  Warm and dry.   IMPRESSION:  1. Palpitations, event monitor benign.  Continue beta-blocker.  2. History of premature atrial contractions.  No evidence of other      atrial arrhythmias or paroxysmal supraventricular tachycardia.      Continue beta-blocker.  3. Hyperlipidemia.  LDLs usually run in the 130 range.  Continue      simvastatin 40 a day.  Follow up with Dr. Clent Smith for a liver test and      followup cholesterol in 6 months.  4. Pre-diabetes or metabolic syndrome.  Continue to encourage walking      and increase exercise.  She has been riding an exercise bike more      since she has been laid off.  She has a glucose monitor and is      following this.  Her hemoglobin A1c is okay.  Continue to modify      diet  with low carbs.   I will see the patient back in a year's time.       Jodi Pick. Eden Emms, MD, [facility]  Electronically Signed    PCN/MedQ  DD: 06/05/2008  DT: 06/06/2008  Job #: [id number]

## 2011-05-13 NOTE — Assessment & Plan Note (Signed)
[facility]                            CARDIOLOGY OFFICE NOTE   Jodi Smith, Jodi Smith                          MRN:          [id number]  DATE:03/08/2008                            DOB:          10-09-1966    Jodi Smith returns today for followup.  She continues to have  palpitations.   I reviewed over 60 pages of her [facility] report. She primarily has  sinus rhythm with sinus arrhythmia and occasional PACs.  There are  occasional episodes of artifact which I did not think were real in  regards to four to five beats of wide complex rhythm.   The patient's palpitations are not related to exercise, they are  intermittent, and she feels flip-flops. She occasionally feels her chest  wall quivering.  I tried to reassure her again about her heart.   She has not had a real good response to beta blockers.  There is a  question of being a type 2 diabetic and with a possible masking of any  hypoglycemic episodes, I think it is reasonable to switch her over to a  calcium blocker to see if this helps her palpitations better.   The patient's coronary risk factors include family history and  hypercholesterolemia.  She has been on simvastatin.  She probably needs  a lipid and liver profile in 6 months.   Last LDL was 132 in June 2008. This was before simvastatin was  increased.   REVIEW OF SYSTEMS:  Remarkable for decreased hearing in the right and  left the ear.  She has hereditary otosclerosis and needs surgical  operation by Dr. Jac Canavan. She is cleared to have this done from a cardiac  perspective.   CURRENT MEDICATIONS:  1. Prilosec 20 a day.  2. Metoprolol 25 in the morning, 12.5 at night to be changed to      Cardizem CD 120.  3. Calcium.  4. Multivitamin.  5. Simvastatin 40 a day.   PHYSICAL EXAMINATION:  Is remarkable for a healthy-appearing young white  female in no distress.  Blood pressure is 110/70, pulse 60 and regular,  afebrile,  respiratory rate 14. Affect appropriate.  HEENT:  Unremarkable.  Carotids are normal without bruit, no  lymphadenopathy, thyromegaly or JVP elevation.  LUNGS:  Clear with good  diaphragmatic motion.  No wheezing.  S1, S2 with normal heart sounds. PMI normal.  ABDOMEN: Benign. Bowel sounds positive. No AAA, no tenderness, no  hepatosplenomegaly. No hepatojugular reflux.  Distal pulses are intact. No edema.  NEURO: Nonfocal.  SKIN:  Warm and dry.  No muscular weakness as indicated.   I reviewed all of her [facility] report with her.   IMPRESSION:  1. Benign palpitations, normal left ventricular function.  No evidence      of structural heart disease.  Is switched to Cardizem CD 120 to see      if this helps  2. Otosclerosis, cleared to have surgery with Dr. Jac Canavan.  3. Hypercholesterolemia.  Continue statin drug,  lipid and liver      profile in 6 months.  4. Reflux.  Continue on Prilosec 20 mg a day.  5. Borderline diabetes, followup hemoglobin A1c in 3 months.  Continue      low caloric diet and I attempt 10 pounds weight loss.   I will see her back in 3 months to reassess her palpitations.     Noralyn Pick. Eden Emms, MD, [facility]  Electronically Signed    PCN/MedQ  DD: 03/08/2008  DT: 03/09/2008  Job #: [id number]

## 2011-05-13 NOTE — Assessment & Plan Note (Signed)
[facility]ALTHCARE                            CARDIOLOGY OFFICE NOTE   Jodi, Smith                          MRN:          [id number]  DATE:01/26/2008                            DOB:          10-22-1966    Jodi Smith is a delightful 45-year patient referred by Dr. Clent Ridges.  She has  been in the emergency room twice for palpitations.   The patient has a fairly longstanding history of PACs since 2006.  Back  in 2007, she had a normal stress test and a normal echo.  She has had  fairly chronic palpitations for quite some time.  Back in 2006, she had  a Holter monitor which showed some PACs and PVCs.  No dangerous  arrhythmias.  She has also been stricken over the last 6-8 months with  multiple episodes of vertigo.  These do sound vertiginous.  Apparently,  she is to get tested with formal postural maneuvers soon.   The patient's palpitations are benign.  They actually are mostly  nonexertional.  They tend to occur at night when she lays down.  She  feels flip flops and some rapid beats that sometimes can cause chest  pain.  She does not really have dyspnea.  She has not had syncope.  She  has been on a beta blocker for over a year.  Dr. Clent Ridges tried to increase  the dose, and at that point, she did have some low blood pressures with  systolics under 100 and some presyncope.   The patient has not had previous valvular heart disease.  No evidence  coronary disease.  No history of sudden death or significant arrhythmias  in the family.   The patient usually gets palpitations with any noxious stimuli.  A few  weeks ago while at [employer], she had some pain in the back of the  neck and then had a warm flush sensation and noted increasing  palpitations.   REVIEW OF SYSTEMS:  Otherwise unremarkable.   Her past medical history is remarkable for bilateral oophorectomy,  femoral hernia repair - both in 1991, hysterectomy with rectocele repair  and cystocele repair  in 2002, inguinal hernia repair in 2005, D&C in  1996, a recent diagnosis of vertigo, hypercholesterolemia.  She is a  nonsmoker, nondrinker.   She is happily married.  She has two children.  Her husband works at  [employer].  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  She does general office and computer work.  She is otherwise  fairly sedentary.  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   FAMILY HISTORY:  Is remarkable for mother being alive at age 67, father  being alive at age 23, no premature coronary disease.   CURRENT MEDICATIONS:  1. Prilosec 20 a day.  2. Metoprolol 25 in the morning and 12.5 at night.  3. Calcium.  4. Multivitamins.  5. Simvastatin 40 a day.   PHYSICAL EXAMINATION:  Is remarkable for a healthy-appearing middle-aged  white female in no distress.  Blood  pressure is 110/70, pulse 60 and  regular, weight 176, afebrile, respiratory rate 14.  HEENT:  Unremarkable.  Carotids are without bruit.  No lymphadenopathy, thyromegaly, JVP  elevation.  LUNGS:  Clear.  Good diaphragmatic motion.  No wheezing.  S1-S2 normal heart sounds.  PMI normal.  ABDOMEN:  Benign.  Bowel sounds positive.  No AAA.  No tenderness.  No  hepatosplenomegaly or hepatojugular reflux.  No tenderness.  Distal pulses are intact.  No edema.  NEUROLOGICAL:  Nonfocal.  SKIN:  Warm and dry.  No muscular weakness.   EKG is normal.  All of her notes from previous heart workups were  reviewed.  Notes from the ER were also reviewed.  All told,  approximately 25 pages of paperwork was reviewed.   IMPRESSION:  1. Benign palpitations.  Since we do have event monitor now, I will      give her one for 30 days.  I will be surprised if we see any      significant arrhythmias.  At some point, it may be worthwhile to      switch her to a low dose of the calcium blocker since the beta      blockers do not seem all that effective in stopping her symptoms      and at times  have caused some low blood pressure.  We will make      this decision after I see her in follow-up in 4-6 weeks to review      her event monitor.  2. Chest pain, atypical.  Normal stress test in 2007.  No need for      follow-up.  Normal exam and EKG.  3. Hypercholesterolemia.  Continue statin drug.  Lipid and liver      profile in 6 months.  4. Vertigo.  Follow-up with Dr. Clent Ridges in ENT.  Possibly use of Antivert.      I am not sure she is actually having cold caloric tests or Barany      maneuvers done formally.  She will follow up with Dr. Clent Ridges in      regards to this problem.     Noralyn Pick. Eden Emms, MD, [facility]  Electronically Signed    PCN/MedQ  DD: 01/26/2008  DT: 01/27/2008  Job #: [id number]   cc:   Jeannett Senior A. Clent Ridges, MD

## 2011-05-16 NOTE — Assessment & Plan Note (Signed)
[facility] OFFICE NOTE   DUBLIN, CANTERO                          MRN:          [id number]  DATE:08/24/2006                            DOB:          02-21-1966    This is a 45 year old woman here to establish with our practice.  She is  also for a nongynecological physical examination.  In general, she has done  well but does have one concern.  About four years ago, she began having  periods of palpitations in her heart.  Sometimes she simply feels a skipping  feeling.  At other times, she feels her heart races for several minutes at a  time.  When it does this, she does get mildly short of breath and she feels  a pressure sensation in her chest.  There is no real pain, exactly, but it  is a very uncomfortable feeling.  She does not break into a sweat.  She does  not get nauseated.  She does not have to stop her activities because of  these spells except for two occasions.  On two such occasions over the last  year, she has had a spell like this where she actually became a bit  lightheaded and had to sit down after a minute or two.  This passed, and she  felt fine again.  She denies any indigestion or heartburn.  This was worked  up with an EKG and a 24-hour Holter monitor about a year ago.  These were  diagnosed as premature atrial contractions.  She said in a 24 hour period,  she had about 900 of these PACs but was told it was nothing to worry about.  Since then, she has completely given up all forms of caffeine and really  takes no types of herbs or vitamins that could have a stimulant effect  either.   PAST MEDICAL HISTORY:  She had been seeing [facility] [place] until she moved to [place] two  years ago.  She had been seeing a gynecologist at [facility] by  the name of Hazle Coca and in fact had a full gynecology examination in  January of this year.  At some point over the next few months, she does plan  to get established with a gynecologist here in [place].  She is a G3, P2,  having had two vaginal deliveries.  She then developed ovarian cysts as well  as a rectocele, a cystocele, and a prolapsed uterus.  In 1991, she had  bilateral oophorectomies.  Also in 1991, she had a right femoral hernia  repaired.  In 2002, she had a total abdominal hysterectomy and also an  anterior and posterior perineal repair for the above problems.  In 2005, she  had a right inguinal hernia repair.  She also has migraine headaches but  lately, they have been fairly well controlled.   ALLERGIES:  None.   MEDICATIONS:  None.   HABITS:  She does not use tobacco or alcohol.  SOCIAL HISTORY:  She is married with children.  She works as an  Environmental health practitioner.   FAMILY HISTORY:  Remarkable for her brother dying with a heart attack at the  age of 65.  She is not sure of what other contributing factors there may  have been.  Other distant family members have heart disease as well.  There  is also a history of osteoporosis and carcinoid tumors in the extended  family.  Her father had prostate cancer.  There is no history of diabetes in  the family.   OBJECTIVE:  VITAL SIGNS:  Height 5 feet 3 inches.  Weight 186.  BP 106/60,  pulse 60 and regular.  GENERAL:  She is quite overweight.  SKIN:  Free of significant lesions.  HEENT:  Eyes clear.  Ears clear.  Pharynx clear.  NECK:  Supple without lymphadenopathy or masses.  LUNGS:  Clear.  HEART:  Regular rate and rhythm without murmurs, rubs, gallops, or lifts.  Pulses are full.  EKG shows sinus rhythm but is mildly abnormal with  inverted T waves in the inferior leads.  ABDOMEN:  Soft.  Normal bowel sounds.  Nontender.  No masses.  EXTREMITIES:  No clubbing, cyanosis or edema.  NEUROLOGIC:  Grossly intact.   She was here for fasting labs on August 20th.  These are all  within normal  limits, including a TSH with the exception of her lipid panel.  Total  cholesterol is fine at 190, but HDL was low at 35 and LDL was high at 141.   ASSESSMENT/PLAN:  1. Complete physical examination:  We talked about dietary changes and      getting more exercise to lose weight.  2. Hyperlipidemia:  We talked about dietary changes she could make and      will check another lipid panel in six months.  3. Premature atrial contractions.  With her mildly abnormal EKG today, I      think this should be worked up a bit farther, and she agrees.  We will      set her up for a treadmill Myoview procedure soon as well as an      echocardiogram.                                   Tera Mater. Clent Ridges, MD   SAF/MedQ  DD:  08/25/2006  DT:  08/25/2006  Job #:  [id number]

## 2011-05-20 ENCOUNTER — Other Ambulatory Visit: Payer: Self-pay | Admitting: Family Medicine

## 2011-06-12 ENCOUNTER — Telehealth: Payer: Self-pay | Admitting: *Deleted

## 2011-06-12 NOTE — Telephone Encounter (Signed)
Okay to continue present dose. Would not recommend dose reduction unless blood pressure is documented to be too low

## 2011-06-12 NOTE — Telephone Encounter (Signed)
I called saying that she was Rx HCTZ 12.5mg  1 qd by Dr. Landis Martins from [facility]. Pt is being monitored on a weight reduction program. Pt is concerned about this effecting her metoprolol 25mg . She is wanting to take 1/2 of the metoprolol so it doesn't effect her bp while on the HCTZ. Is this ok to do?

## 2011-06-12 NOTE — Telephone Encounter (Signed)
Pt aware of this. 

## 2011-07-03 IMAGING — CR DG CHEST 1V PORT
1 series · 1 of 1 positions shown · non-contrast
Comparison: 11/14/2009.

CLINICAL DATA: 44-year-old female with left side chest pain.

PORTABLE CHEST - 1 VIEW

[view not recorded]
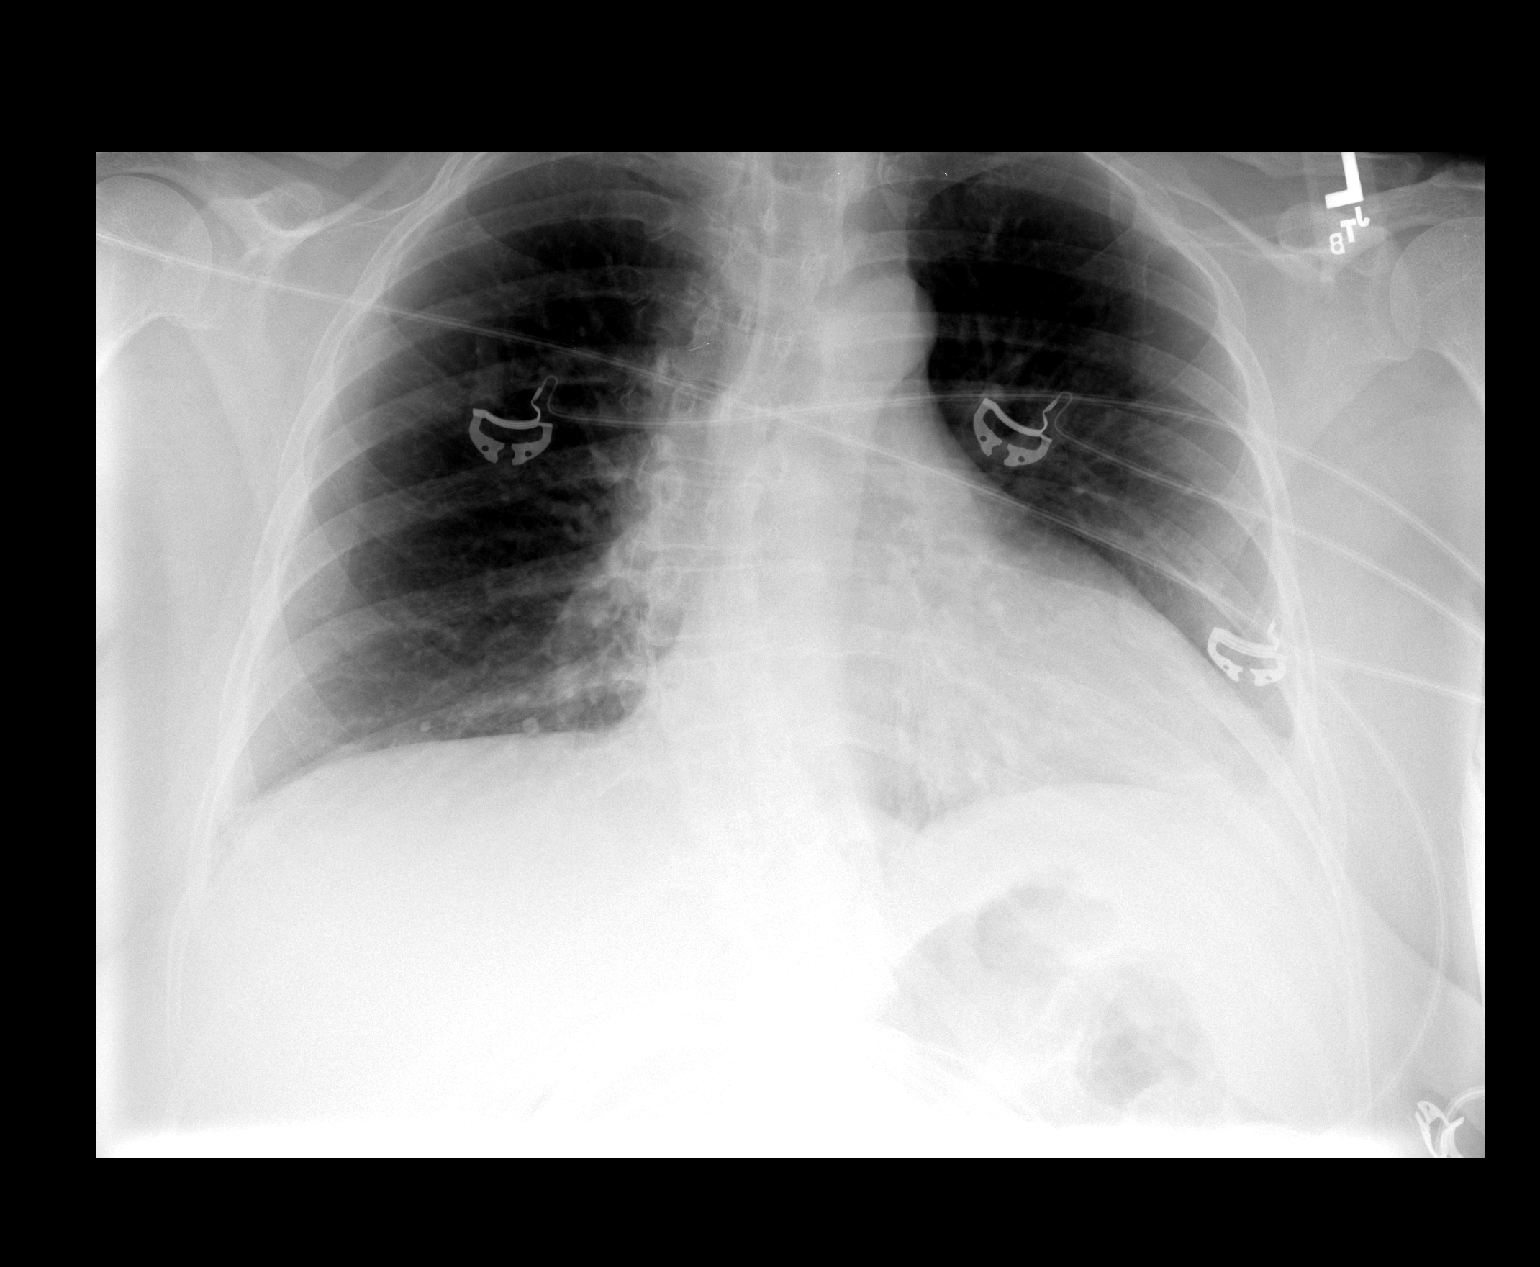

[1 of 1 positions shown; findings below may reference images not displayed]

FINDINGS: AP portable semi upright view at 3534 hours.  Blunting of
the left lateral costophrenic sulcus.  Similar lung volumes.
Scoliosis.  Cardiac size and mediastinal contours are within normal
limits.  No pneumothorax, pulmonary edema, or other focal pulmonary
opacity.  EKG leads and wires overlie the chest.
IMPRESSION: Blunting of the left lateral costophrenic angle is new and could
reflect small effusion or airspace disease.  Otherwise stable chest
with scoliosis.

## 2011-08-08 ENCOUNTER — Other Ambulatory Visit: Payer: Self-pay | Admitting: Family Medicine

## 2011-08-12 ENCOUNTER — Telehealth: Payer: Self-pay | Admitting: Family Medicine

## 2011-08-12 NOTE — Telephone Encounter (Signed)
Refill request for Sumatripan Succ 100 mg take prn. I do not see where the pt has been seen recently and script last filled on 05/20/11.

## 2011-08-12 NOTE — Telephone Encounter (Signed)
Call in #9 with 11 rf  

## 2011-08-13 MED ORDER — SUMATRIPTAN SUCCINATE 100 MG PO TABS: 100.0000 mg | ORAL_TABLET | ORAL | Status: DC | PRN

## 2011-08-13 NOTE — Telephone Encounter (Signed)
rx called into pharmacy

## 2011-09-19 LAB — URINALYSIS, ROUTINE W REFLEX MICROSCOPIC
Bilirubin Urine: NEGATIVE
Nitrite: NEGATIVE
Specific Gravity, Urine: 1.011
pH: 6.5

## 2011-09-19 LAB — DIFFERENTIAL
Eosinophils Absolute: 0.2
Lymphocytes Relative: 30
Lymphs Abs: 2.3
Monocytes Relative: 6
Neutrophils Relative %: 61

## 2011-09-19 LAB — CBC
MCV: 87.2
RBC: 4.65
WBC: 7.8

## 2011-09-19 LAB — BASIC METABOLIC PANEL
Chloride: 105
Creatinine, Ser: 0.76
GFR calc Af Amer: >60
Potassium: 3.7

## 2011-09-19 LAB — TSH: TSH: 1.247

## 2011-09-19 LAB — POCT PREGNANCY, URINE: Operator id: 26138

## 2011-10-29 ENCOUNTER — Other Ambulatory Visit: Payer: Self-pay | Admitting: Family Medicine

## 2011-11-07 ENCOUNTER — Other Ambulatory Visit: Payer: Self-pay | Admitting: Family Medicine

## 2011-11-12 ENCOUNTER — Telehealth: Payer: Self-pay | Admitting: Family Medicine

## 2011-11-12 DIAGNOSIS — E119 Type 2 diabetes mellitus without complications: Secondary | ICD-10-CM

## 2011-11-12 NOTE — Telephone Encounter (Signed)
Pt requested a refill on testing strips and also wants to [place] to order labs.

## 2011-11-13 ENCOUNTER — Other Ambulatory Visit: Payer: Self-pay | Admitting: Family Medicine

## 2011-11-13 NOTE — Telephone Encounter (Signed)
Please send in Onetouch Ultra Test Strips to [facility] in [place]. Pt wants lab order for A1c and cholesterol . Call when pt can make the lab appointment.

## 2011-11-14 MED ORDER — GLUCOSE BLOOD VI STRP: ORAL_STRIP | Status: AC

## 2011-11-14 NOTE — Telephone Encounter (Signed)
Please set up a lipid panel and A1c for 250.00

## 2011-11-14 NOTE — Telephone Encounter (Signed)
rx for onetouch ultra strips sent to pharmacy  Pls advise about lab work.

## 2011-11-17 ENCOUNTER — Telehealth: Payer: Self-pay | Admitting: Family Medicine

## 2011-11-17 NOTE — Telephone Encounter (Signed)
Lft vm for pt to call back and sch labs as ordered. Also left message that one touch ultra test strips, were sent in to [facility] pharmacy on 11/13/11. Waiting on call back from pt.

## 2011-11-17 NOTE — Telephone Encounter (Signed)
Pt returned call and has been sch for labs as noted. Pt also said that [facility] in [facility] has not rcvd script for pts One Touch Ultra test strips.A new phone note has been sent to nurse, stating that script needs to be called in to pharmacy.

## 2011-11-17 NOTE — Telephone Encounter (Signed)
I put future lab order in computer. Please call to schedule labs and it is a fasting lab.

## 2011-11-17 NOTE — Telephone Encounter (Signed)
Pt says that [facility] in [facility] did not rcv script for One Touch Test Strips. Pls call in to pharmacy and notify pt when its been done and which pharmacy it was sent to.

## 2011-11-17 NOTE — Telephone Encounter (Signed)
I phoned in script and spoke with pt.

## 2011-11-24 ENCOUNTER — Other Ambulatory Visit: Payer: Self-pay | Admitting: Family Medicine

## 2011-12-04 ENCOUNTER — Other Ambulatory Visit (INDEPENDENT_AMBULATORY_CARE_PROVIDER_SITE_OTHER): Payer: Managed Care, Other (non HMO)

## 2011-12-04 DIAGNOSIS — E119 Type 2 diabetes mellitus without complications: Secondary | ICD-10-CM

## 2011-12-04 LAB — LIPID PANEL
Cholesterol: 211 mg/dL — ABNORMAL HIGH (ref 0–200)
Triglycerides: 39 mg/dL (ref 0.0–149.0)

## 2011-12-04 LAB — HEMOGLOBIN A1C: Hgb A1c MFr Bld: 5.8 % (ref 4.6–6.5)

## 2011-12-09 ENCOUNTER — Encounter: Payer: Self-pay | Admitting: Family Medicine

## 2011-12-09 NOTE — Progress Notes (Signed)
Quick Note:  Spoke with pt and put a copy in mail. ______ 

## 2011-12-27 ENCOUNTER — Other Ambulatory Visit: Payer: Self-pay | Admitting: Family Medicine

## 2011-12-31 NOTE — Telephone Encounter (Signed)
Script sent to [facility] and left voice message for pt.

## 2012-01-20 ENCOUNTER — Encounter: Payer: Self-pay | Admitting: *Deleted

## 2012-01-22 ENCOUNTER — Encounter: Payer: Self-pay | Admitting: *Deleted

## 2012-01-22 ENCOUNTER — Ambulatory Visit (INDEPENDENT_AMBULATORY_CARE_PROVIDER_SITE_OTHER): Payer: Managed Care, Other (non HMO) | Admitting: Cardiovascular Disease

## 2012-01-22 ENCOUNTER — Encounter: Payer: Self-pay | Admitting: Cardiovascular Disease

## 2012-01-22 VITALS — BP 118/68 | HR 74 | Ht 62.0 in | Wt 183.0 lb

## 2012-01-22 DIAGNOSIS — E119 Type 2 diabetes mellitus without complications: Secondary | ICD-10-CM

## 2012-01-22 DIAGNOSIS — E785 Hyperlipidemia, unspecified: Secondary | ICD-10-CM

## 2012-01-22 DIAGNOSIS — R002 Palpitations: Secondary | ICD-10-CM

## 2012-01-22 DIAGNOSIS — I10 Essential (primary) hypertension: Secondary | ICD-10-CM

## 2012-01-22 MED ORDER — SIMVASTATIN 10 MG PO TABS: 10.0000 mg | ORAL_TABLET | Freq: Every evening | ORAL | Status: DC

## 2012-01-22 NOTE — Progress Notes (Signed)
Patient ID: Jodi Smith, female   DOB: 1966/11/13, 46 y.o.   MRN: [id number] Last seen 01/27/11 for PACs palpitations, elevated cholesterol. On BB  Stopped her statin but LDL now 148.  Discussed Paleo and [employer] as low carb will help with her type 2 DM. Still with occasional "sinking" feelings and palpitatons but nothing new. 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 Discussed utility of continued BB Rx given migraines and palpitaitons. A1c is fine at 5.8  Discussed insulin resitance with beta blocker.  Ok to wean beta blocker to off and see how she does as palpitations are benign If she needs one resumed consider coreg.  Will restart low dose statin.  Discussed exercise program.  Has had plantar fasciatis and recumbent bike would be good  ROS: Denies fever, malais, weight loss, blurry vision, decreased visual acuity, cough, sputum, SOB, hemoptysis, pleuritic pain, palpitaitons, heartburn, abdominal pain, melena, lower extremity edema, claudication, or rash.  All other systems reviewed and negative  General: Affect appropriate Healthy:  appears stated age HEENT: normal Neck supple with no adenopathy JVP normal no bruits no thyromegaly Lungs clear with no wheezing and good diaphragmatic motion Heart:  S1/S2 no murmur, no rub, gallop or click PMI normal Abdomen: benighn, BS positve, no tenderness, no AAA no bruit.  No HSM or HJR Distal pulses intact with no bruits No edema Neuro non-focal Skin warm and dry No muscular weakness   Current Outpatient Prescriptions  Medication Sig Dispense Refill  . co-enzyme Q-10 30 MG capsule Take 30 mg by mouth daily.      Marland Kitchen glucose blood test strip Use as instructed  100 each  12  . metoprolol tartrate (LOPRESSOR) 25 MG tablet TAKE 1 TABLET EVERY MORNING AND TAKE 1/2 TABLET EVERY EVENING  45 tablet  1  . multivitamin (THERAGRAN) per tablet Take 1 tablet by mouth daily.      . ONE TOUCH ULTRA TEST test strip USE 3  TIMES DAILY  100 each  2  . SUMAtriptan (IMITREX) 100 MG tablet Take 1 tablet (100 mg total) by mouth as needed for migraine.  9 tablet  11    Allergies  Review of patient's allergies indicates no known allergies.  Electrocardiogram:  NSR rate 74  Nonspecific ST/T wave changes  Assessment and Plan

## 2012-01-22 NOTE — Assessment & Plan Note (Signed)
Well controlled.  Continue current medications and low sodium Dash type diet.    

## 2012-01-22 NOTE — Assessment & Plan Note (Signed)
Discussed low carb diet.  Target hemoglobin A1c is 6.5 or less.  Continue current medications. Sees dietician at [facility].  A!C 5.8

## 2012-01-22 NOTE — Assessment & Plan Note (Signed)
Will try to stop beta blocker and see if they recur.  If they do or migraines come back resume with coreg for less effect on insulin

## 2012-01-22 NOTE — Assessment & Plan Note (Signed)
Resume low dose statin  Labs 3 months.  Target LDL under 130  If she stops statin again discussed red yeast rice

## 2012-01-22 NOTE — Patient Instructions (Signed)
Your physician wants you to follow-up in: 6 months. You will receive a reminder letter in the mail two months in advance. If you don't receive a letter, please call our office to schedule the follow-up appointment.  Your physician recommends that you return for lab work in 3 months for lipids, liver and bmet.  Your physician has recommended you make the following change in your medication: Simvastatin 10mg  daily at bedtime.

## 2012-04-20 ENCOUNTER — Other Ambulatory Visit: Payer: Managed Care, Other (non HMO)

## 2012-06-25 ENCOUNTER — Encounter: Payer: Self-pay | Admitting: Family Medicine

## 2012-06-25 ENCOUNTER — Ambulatory Visit (INDEPENDENT_AMBULATORY_CARE_PROVIDER_SITE_OTHER): Payer: Managed Care, Other (non HMO) | Admitting: Family Medicine

## 2012-06-25 VITALS — BP 104/70 | HR 99 | Temp 98.5°F

## 2012-06-25 DIAGNOSIS — S93409A Sprain of unspecified ligament of unspecified ankle, initial encounter: Secondary | ICD-10-CM

## 2012-07-05 ENCOUNTER — Encounter: Payer: Self-pay | Admitting: Family Medicine

## 2012-07-05 NOTE — Progress Notes (Signed)
  Subjective:    Patient ID: Jodi Smith, female    DOB: 06/20/1966, 46 y.o.   MRN: [id number]  HPI Here to recheck an injury to the right ankle. On 06-19-12 while visiting someone in [place], she slipped and fell, injuring the right lateral ankle. She saw an Urgent Care there, and Xrays revealed no fractures. She was given an Aircast splint which she has been wearing ever since. The pain has improved a lot, and she is now taking only Motrin for it.   Review of Systems  Constitutional: Negative.   Musculoskeletal: Positive for joint swelling and arthralgias.       Objective:   Physical Exam  Constitutional:       Walks with a slight limp  Musculoskeletal:       The right lateral ankle is slightly swollen and tender. No warmth or crepitus. Full ROM.           Assessment & Plan:  The sprain seems to be healing as expected. She will stop using the splint and will wear a lace up ankle support instead. She will start doing some non-weight bearing ROM exercises several times daily. She should be able to go without any support in another week or two. Return to work next week.

## 2012-07-20 ENCOUNTER — Telehealth: Payer: Self-pay | Admitting: Family Medicine

## 2012-07-20 DIAGNOSIS — E119 Type 2 diabetes mellitus without complications: Secondary | ICD-10-CM

## 2012-07-20 DIAGNOSIS — S93409A Sprain of unspecified ligament of unspecified ankle, initial encounter: Secondary | ICD-10-CM

## 2012-07-20 NOTE — Telephone Encounter (Signed)
Pt is requesting physical therapy, right ankle is still hurting. Also she would like to have a referral to see Dr. Saddie Benders at [facility] for diabetes, metabolism and weight loss.

## 2012-07-21 NOTE — Telephone Encounter (Signed)
Both referrals were ordered

## 2012-07-21 NOTE — Telephone Encounter (Signed)
I left a voice message with below information. 

## 2012-07-26 ENCOUNTER — Ambulatory Visit: Payer: Managed Care, Other (non HMO) | Attending: Family Medicine

## 2012-07-26 DIAGNOSIS — M25579 Pain in unspecified ankle and joints of unspecified foot: Secondary | ICD-10-CM | POA: Insufficient documentation

## 2012-07-26 DIAGNOSIS — R5381 Other malaise: Secondary | ICD-10-CM | POA: Insufficient documentation

## 2012-07-26 DIAGNOSIS — IMO0001 Reserved for inherently not codable concepts without codable children: Secondary | ICD-10-CM | POA: Insufficient documentation

## 2012-07-26 DIAGNOSIS — R262 Difficulty in walking, not elsewhere classified: Secondary | ICD-10-CM | POA: Insufficient documentation

## 2012-07-29 ENCOUNTER — Ambulatory Visit: Payer: Managed Care, Other (non HMO) | Attending: Family Medicine | Admitting: Physical Therapy

## 2012-07-29 DIAGNOSIS — R5381 Other malaise: Secondary | ICD-10-CM | POA: Insufficient documentation

## 2012-07-29 DIAGNOSIS — IMO0001 Reserved for inherently not codable concepts without codable children: Secondary | ICD-10-CM | POA: Insufficient documentation

## 2012-07-29 DIAGNOSIS — M25579 Pain in unspecified ankle and joints of unspecified foot: Secondary | ICD-10-CM | POA: Insufficient documentation

## 2012-07-29 DIAGNOSIS — R262 Difficulty in walking, not elsewhere classified: Secondary | ICD-10-CM | POA: Insufficient documentation

## 2012-08-04 ENCOUNTER — Ambulatory Visit: Payer: Managed Care, Other (non HMO)

## 2012-08-16 ENCOUNTER — Ambulatory Visit: Payer: Managed Care, Other (non HMO)

## 2012-08-18 ENCOUNTER — Ambulatory Visit: Payer: Managed Care, Other (non HMO)

## 2012-08-23 ENCOUNTER — Ambulatory Visit: Payer: Managed Care, Other (non HMO)

## 2012-09-18 ENCOUNTER — Other Ambulatory Visit: Payer: Self-pay | Admitting: Family Medicine

## 2012-09-30 ENCOUNTER — Ambulatory Visit (INDEPENDENT_AMBULATORY_CARE_PROVIDER_SITE_OTHER): Payer: Managed Care, Other (non HMO) | Admitting: Cardiovascular Disease

## 2012-09-30 ENCOUNTER — Encounter: Payer: Self-pay | Admitting: Cardiovascular Disease

## 2012-09-30 VITALS — BP 125/66 | HR 86 | Ht 62.0 in | Wt 186.0 lb

## 2012-09-30 DIAGNOSIS — Z8679 Personal history of other diseases of the circulatory system: Secondary | ICD-10-CM

## 2012-09-30 DIAGNOSIS — I10 Essential (primary) hypertension: Secondary | ICD-10-CM

## 2012-09-30 DIAGNOSIS — E7800 Pure hypercholesterolemia, unspecified: Secondary | ICD-10-CM

## 2012-09-30 NOTE — Assessment & Plan Note (Signed)
Resolved no longer in need of beta blockers

## 2012-09-30 NOTE — Assessment & Plan Note (Signed)
No statins but can try red yeast  F/U [facility] and has had recent lab work there

## 2012-09-30 NOTE — Patient Instructions (Signed)
Your physician wants you to follow-up in: YEAR WITH DR NISHAN  You will receive a reminder letter in the mail two months in advance. If you don't receive a letter, please call our office to schedule the follow-up appointment.  Your physician recommends that you continue on your current medications as directed. Please refer to the Current Medication list given to you today. 

## 2012-09-30 NOTE — Assessment & Plan Note (Signed)
Well controlled.  Continue current medications and low sodium Dash type diet.    

## 2012-09-30 NOTE — Progress Notes (Signed)
Patient ID: Jodi Smith, female   DOB: 1966/02/02, 46 y.o.   MRN: [id number] Last seen 01/27/11 for PACs palpitations, elevated cholesterol.Still with occasional "sinking" feelings and palpitatons but nothing new. 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 Discussed utility of continued BB Rx given migraines and palpitaitons. A1c is fine at 5.8 Discussed insulin resitance with beta blocker.  She has been off beta blocker and actually feels better  Discussed exercise program. Has had plantar fasciatis and recumbent bike would be good  Has been seeing doctor at [facility] for weight loss and low carb diet.  Migraines cyclical with menses Still has one ovary.  Occasional vertigo and migraines about once/month that she uses imitrex for   ROS: Denies fever, malais, weight loss, blurry vision, decreased visual acuity, cough, sputum, SOB, hemoptysis, pleuritic pain, palpitaitons, heartburn, abdominal pain, melena, lower extremity edema, claudication, or rash.  All other systems reviewed and negative  General: Affect appropriate Healthy:  appears stated age HEENT: normal Neck supple with no adenopathy JVP normal no bruits no thyromegaly Lungs clear with no wheezing and good diaphragmatic motion Heart:  S1/S2 no murmur, no rub, gallop or click PMI normal Abdomen: benighn, BS positve, no tenderness, no AAA no bruit.  No HSM or HJR Distal pulses intact with no bruits No edema Neuro non-focal Skin warm and dry No muscular weakness   Current Outpatient Prescriptions  Medication Sig Dispense Refill  . aspirin 81 MG tablet Take 81 mg by mouth daily.      Marland Kitchen BIOTIN PO Take 1 tablet by mouth daily.      Marland Kitchen co-enzyme Q-10 30 MG capsule Take 30 mg by mouth daily.      Marland Kitchen glucose blood test strip Use as instructed  100 each  12  . MAGNESIUM CARBONATE PO Take 1 tablet by mouth daily.      . multivitamin (THERAGRAN) per tablet Take 1 tablet by mouth daily.      . Omega-3 Fatty  Acids (FISH OIL PO) Take 1 tablet by mouth daily.      . ONE TOUCH ULTRA TEST test strip USE 3 TIMES DAILY  100 each  2  . SUMAtriptan (IMITREX) 100 MG tablet TAKE ONE TABLET AS NEEDEDFOR MIGRAINE  9 tablet  11    Allergies  Review of patient's allergies indicates no known allergies.  Electrocardiogram:  01/22/12  NSR normal ECG  Assessment and Plan

## 2013-01-18 ENCOUNTER — Ambulatory Visit (INDEPENDENT_AMBULATORY_CARE_PROVIDER_SITE_OTHER): Payer: Managed Care, Other (non HMO) | Admitting: Internal Medicine

## 2013-01-18 ENCOUNTER — Encounter: Payer: Self-pay | Admitting: Internal Medicine

## 2013-01-18 ENCOUNTER — Encounter (INDEPENDENT_AMBULATORY_CARE_PROVIDER_SITE_OTHER): Payer: Managed Care, Other (non HMO)

## 2013-01-18 VITALS — BP 124/90 | Temp 98.0°F | Wt 196.0 lb

## 2013-01-18 DIAGNOSIS — M79609 Pain in unspecified limb: Secondary | ICD-10-CM

## 2013-01-18 DIAGNOSIS — M79605 Pain in left leg: Secondary | ICD-10-CM | POA: Insufficient documentation

## 2013-01-18 NOTE — Assessment & Plan Note (Signed)
47 year old white female presents with unprovoked left lower leg pain. Rule out DVT. If Doppler negative for DVT her symptoms may be secondary superficial thrombophlebitis vs symptomatic varicose veins. Patient advised to continue using warm compress and ibuprofen as needed. Prescription for compression stockings also provided. Follow up with primary care physician within 2 weeks.

## 2013-01-18 NOTE — Patient Instructions (Addendum)
Please follow up with Dr. Clent Ridges within 2 weeks Please call our office if your symptoms do not improve or gets worse.

## 2013-01-18 NOTE — Progress Notes (Signed)
Subjective:    Patient ID: Jodi Smith, female    DOB: [birth year], 47 y.o.   MRN: [id number]  HPI  47 year old white female with history of palpitations complains of left calf pain for 5 days. Symptoms localized to lower aspect of left leg/calf. She feels a "knot". Patient thinks did not moves when she walks.  She denies any injury or trauma. She denies history of blood clots. She denies family history of blood clots. She is a nonsmoker and is not on BCPs.  Review of Systems Negative for chest pain or shortness of breath  Past Medical History  Diagnosis Date  . Hypertension   . Hyperlipidemia   . Tachycardia   . Heart palpitations   . GERD (gastroesophageal reflux disease)   . Otosclerosis   . Weight loss   . Anal and rectal polyp   . Diabetes mellitus   . Tinnitus   . Migraine headache   . Anxiety   . Vertigo   . History of hysterectomy     History   Social History  . Marital Status: Married    Spouse Name: N/A    Number of Children: N/A  . Years of Education: N/A   Occupational History  . Not on file.   Social History Main Topics  . Smoking status: Never Smoker   . Smokeless tobacco: Never Used  . Alcohol Use: No  . Drug Use: No  . Sexually Active: Not on file   Other Topics Concern  . Not on file   Social History Narrative  . No narrative on file    Past Surgical History  Procedure Date  . Oophorectomy 1991  . Perineal body repair 2002    anterior and posterior -- for rectal prolapse  . Hernia repair 1991    right fermoral hernia repair  . Inguinal hernia repair 2005    Family History  Problem Relation Age of Onset  . Heart attack Brother 34  . Prostate cancer    . Heart disease    . Breast cancer Maternal Aunt   . Irritable bowel syndrome Son     No Known Allergies  Current Outpatient Prescriptions on File Prior to Visit  Medication Sig Dispense Refill  . aspirin 81 MG tablet Take 81 mg by mouth daily.      Marland Kitchen BIOTIN PO Take 1 tablet  by mouth daily.      Marland Kitchen co-enzyme Q-10 30 MG capsule Take 30 mg by mouth daily.      Marland Kitchen MAGNESIUM CARBONATE PO Take 1 tablet by mouth daily.      . multivitamin (THERAGRAN) per tablet Take 1 tablet by mouth daily.      . Omega-3 Fatty Acids (FISH OIL PO) Take 1 tablet by mouth daily.      . ONE TOUCH ULTRA TEST test strip USE 3 TIMES DAILY  100 each  2  . SUMAtriptan (IMITREX) 100 MG tablet TAKE ONE TABLET AS NEEDEDFOR MIGRAINE  9 tablet  11    BP 124/90  Temp 98 F (36.7 C) (Oral)  Wt 196 lb (88.905 kg)       Objective:   Physical Exam  Constitutional: She appears well-developed and well-nourished.  Cardiovascular: Normal rate, regular rhythm and normal heart sounds.   No murmur heard. Pulmonary/Chest: Effort normal and breath sounds normal. She has no wheezes.  Musculoskeletal:       No edema of left lower extremity, left lower leg tenderness, Unable to palpate mass or  nodule Bilateral spider veins/varicosities          Assessment & Plan:

## 2013-03-03 ENCOUNTER — Ambulatory Visit: Payer: Managed Care, Other (non HMO) | Admitting: Family Medicine

## 2013-03-04 ENCOUNTER — Ambulatory Visit: Payer: Managed Care, Other (non HMO) | Admitting: Family Medicine

## 2013-03-07 ENCOUNTER — Ambulatory Visit (INDEPENDENT_AMBULATORY_CARE_PROVIDER_SITE_OTHER): Payer: Managed Care, Other (non HMO) | Admitting: Family Medicine

## 2013-03-07 ENCOUNTER — Encounter: Payer: Self-pay | Admitting: Family Medicine

## 2013-03-07 VITALS — BP 122/88 | HR 98 | Temp 98.5°F | Wt 194.0 lb

## 2013-03-07 DIAGNOSIS — K219 Gastro-esophageal reflux disease without esophagitis: Secondary | ICD-10-CM

## 2013-03-07 DIAGNOSIS — R635 Abnormal weight gain: Secondary | ICD-10-CM

## 2013-03-07 LAB — HEPATIC FUNCTION PANEL
AST: 16 U/L (ref 0–37)
Total Bilirubin: 0.6 mg/dL (ref 0.3–1.2)

## 2013-03-07 LAB — BASIC METABOLIC PANEL
BUN: 14 mg/dL (ref 6–23)
GFR: 98.64 mL/min (ref >60.00)
Potassium: 4.3 mEq/L (ref 3.5–5.1)

## 2013-03-07 LAB — CBC WITH DIFFERENTIAL/PLATELET
Eosinophils Relative: 1.7 % (ref 0.0–5.0)
HCT: 41.7 % (ref 36.0–46.0)
Hemoglobin: 14.1 g/dL (ref 12.0–15.0)
Lymphs Abs: 2.2 10*3/uL (ref 0.7–4.0)
Monocytes Relative: 6.7 % (ref 3.0–12.0)
Neutro Abs: 4.4 10*3/uL (ref 1.4–7.7)
RDW: 14.2 % (ref 11.5–14.6)
WBC: 7.2 10*3/uL (ref 4.5–10.5)

## 2013-03-07 LAB — TSH: TSH: 2.1 u[IU]/mL (ref 0.35–5.50)

## 2013-03-07 MED ORDER — OMEPRAZOLE 40 MG PO CPDR: 40.0000 mg | DELAYED_RELEASE_CAPSULE | Freq: Every day | ORAL | Status: DC

## 2013-03-07 NOTE — Progress Notes (Signed)
  Subjective:    Patient ID: Jodi Smith, female    DOB: 01/10/1966, 47 y.o.   MRN: [id number]  HPI Here for 2 weeks of intermittent pains in the epigastrium and LUQ, also for a lot of reflux and heartburn. No nausea or fever. BMs are regular. TUMS helps a little. She feels better with food in her stomach.    Review of Systems  Constitutional: Negative.   Gastrointestinal: Positive for abdominal pain. Negative for nausea, vomiting, diarrhea, constipation, blood in stool and abdominal distention.       Objective:   Physical Exam  Constitutional: She appears well-developed and well-nourished.  Abdominal: Soft. Bowel sounds are normal. She exhibits no distension and no mass. There is no rebound and no guarding.  Mildly tender in the epigastrium           Assessment & Plan:  She has gastritis and GERD. Try Omeprazole and a bland diet.

## 2013-03-09 NOTE — Progress Notes (Signed)
Quick Note:  I left voice message with results. ______ 

## 2013-03-11 ENCOUNTER — Encounter: Payer: Self-pay | Admitting: Family Medicine

## 2013-03-17 NOTE — Telephone Encounter (Signed)
This referral was done. 

## 2013-04-05 ENCOUNTER — Encounter: Payer: Managed Care, Other (non HMO) | Attending: Family Medicine | Admitting: *Deleted

## 2013-04-05 VITALS — Ht 62.0 in | Wt 197.2 lb

## 2013-04-05 DIAGNOSIS — Z713 Dietary counseling and surveillance: Secondary | ICD-10-CM | POA: Insufficient documentation

## 2013-04-05 DIAGNOSIS — E119 Type 2 diabetes mellitus without complications: Secondary | ICD-10-CM | POA: Insufficient documentation

## 2013-04-08 ENCOUNTER — Encounter: Payer: Self-pay | Admitting: *Deleted

## 2013-04-08 NOTE — Progress Notes (Signed)
Patient was seen on 04/05/2013 for the first of a series of three diabetes self-management courses at [facility]. Patient's most recent A1c was 5.6 % on 12/06/2011 The following learning objectives were met by the patient during this course:   Defines the role of glucose and insulin  Identifies type of diabetes and pathophysiology  Defines the diagnostic criteria for diabetes and prediabetes  States the risk factors for Type 2 Diabetes  States the symptoms of Type 2 Diabetes  Defines Type 2 Diabetes treatment goals  Defines Type 2 Diabetes treatment options  States the rationale for glucose monitoring  Identifies A1C, glucose targets, and testing times  Identifies proper sharps disposal  Defines the purpose of a diabetes food plan  Identifies carbohydrate food groups  Defines effects of carbohydrate foods on glucose levels  Identifies carbohydrate choices/grams/food labels  States benefits of physical activity and effect on glucose  Review of suggested activity guidelines  Handouts given during class include:  Type 2 Diabetes: Basics Book  My Food Plan Book  Food and Activity Log   Follow-Up Plan: Core Class 2

## 2013-04-20 ENCOUNTER — Ambulatory Visit: Payer: Managed Care, Other (non HMO) | Admitting: *Deleted

## 2013-04-26 ENCOUNTER — Ambulatory Visit: Payer: Managed Care, Other (non HMO) | Admitting: *Deleted

## 2013-06-22 ENCOUNTER — Other Ambulatory Visit: Payer: Self-pay

## 2013-06-22 DIAGNOSIS — Z1231 Encounter for screening mammogram for malignant neoplasm of breast: Secondary | ICD-10-CM

## 2013-07-06 ENCOUNTER — Ambulatory Visit: Payer: Managed Care, Other (non HMO)

## 2013-07-19 ENCOUNTER — Ambulatory Visit: Payer: Managed Care, Other (non HMO)

## 2013-07-21 ENCOUNTER — Ambulatory Visit
Admission: RE | Admit: 2013-07-21 | Discharge: 2013-07-21 | Disposition: A | Discharge status: Home / Self Care | Payer: Managed Care, Other (non HMO) | Source: Ambulatory Visit

## 2013-07-21 DIAGNOSIS — Z1231 Encounter for screening mammogram for malignant neoplasm of breast: Secondary | ICD-10-CM

## 2013-07-25 ENCOUNTER — Other Ambulatory Visit: Payer: Self-pay | Admitting: Family Medicine

## 2013-07-25 DIAGNOSIS — R928 Other abnormal and inconclusive findings on diagnostic imaging of breast: Secondary | ICD-10-CM

## 2013-08-09 ENCOUNTER — Ambulatory Visit
Admission: RE | Admit: 2013-08-09 | Discharge: 2013-08-09 | Disposition: A | Discharge status: Home / Self Care | Payer: Managed Care, Other (non HMO) | Source: Ambulatory Visit | Attending: Family Medicine | Admitting: Family Medicine

## 2013-08-09 DIAGNOSIS — R928 Other abnormal and inconclusive findings on diagnostic imaging of breast: Secondary | ICD-10-CM

## 2013-09-10 ENCOUNTER — Encounter: Payer: Self-pay | Admitting: Cardiovascular Disease

## 2013-09-10 ENCOUNTER — Encounter: Payer: Self-pay | Admitting: Family Medicine

## 2013-09-14 ENCOUNTER — Encounter: Payer: Self-pay | Admitting: Family Medicine

## 2013-09-22 ENCOUNTER — Encounter: Payer: Self-pay | Admitting: *Deleted

## 2013-10-27 ENCOUNTER — Other Ambulatory Visit: Payer: Self-pay | Admitting: Family Medicine

## 2013-11-03 ENCOUNTER — Other Ambulatory Visit: Payer: Self-pay

## 2014-01-27 ENCOUNTER — Encounter: Payer: Self-pay | Admitting: Cardiovascular Disease

## 2014-01-27 ENCOUNTER — Ambulatory Visit (INDEPENDENT_AMBULATORY_CARE_PROVIDER_SITE_OTHER): Payer: Managed Care, Other (non HMO) | Admitting: Cardiovascular Disease

## 2014-01-27 VITALS — BP 118/78 | HR 85 | Ht 63.0 in | Wt 201.0 lb

## 2014-01-27 DIAGNOSIS — R002 Palpitations: Secondary | ICD-10-CM

## 2014-01-27 DIAGNOSIS — I10 Essential (primary) hypertension: Secondary | ICD-10-CM

## 2014-01-27 DIAGNOSIS — E119 Type 2 diabetes mellitus without complications: Secondary | ICD-10-CM

## 2014-01-27 DIAGNOSIS — N816 Rectocele: Secondary | ICD-10-CM

## 2014-01-27 NOTE — Patient Instructions (Signed)
Your physician wants you to follow-up in: YEAR WITH DR Haywood FillerNISHAN  You will receive a reminder letter in the mail two months in advance. If you don't receive a letter, please call our office to schedule the follow-up appointment. Your physician recommends that you continue on your current medications as directed. Please refer to the Current Medication list given to you today. DR Wenda LowMATT MARTIN   [phone]

## 2014-01-27 NOTE — Assessment & Plan Note (Signed)
Discussed low carb diet.  Target hemoglobin A1c is 6.5 or less.  Continue current medications.  

## 2014-01-27 NOTE — Assessment & Plan Note (Signed)
Well controlled.  Continue current medications and low sodium Dash type diet.    

## 2014-01-27 NOTE — Progress Notes (Signed)
Patient ID: Jodi Smith, female   DOB: 03/08/1966, 48 y.o.   MRN: [id number] Last seen 01/27/11 for PACs palpitations, elevated cholesterol.Still with occasional "sinking" feelings and palpitatons but nothing new. 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 Sedentary outside of occasoinal yoga. Discussed utility of continued BB Rx given migraines and palpitaitons. A1c is fine at 5.8 Discussed insulin resitance with beta blocker. She has been off beta blocker and actually feels better Discussed exercise program. Has had plantar fasciatis and recumbent bike would be good  [facility]. Migraines cyclical with menses Still has one ovary. Occasional vertigo and migraines about once/month that she uses imitrex for   Had pelvic floor reconstruction Still emotional about weight gain.     ROS: Denies fever, malais, weight loss, blurry vision, decreased visual acuity, cough, sputum, SOB, hemoptysis, pleuritic pain, palpitaitons, heartburn, abdominal pain, melena, lower extremity edema, claudication, or rash.  All other systems reviewed and negative  General: Affect appropriate Overweight tearful female  HEENT: normal Neck supple with no adenopathy JVP normal no bruits no thyromegaly Lungs clear with no wheezing and good diaphragmatic motion Heart:  S1/S2 no murmur, no rub, gallop or click PMI normal Abdomen: benighn, BS positve, no tenderness, no AAA no bruit.  No HSM or HJR Distal pulses intact with no bruits No edema Neuro non-focal Skin warm and dry No muscular weakness   Current Outpatient Prescriptions  Medication Sig Dispense Refill  . aspirin 81 MG tablet Take 81 mg by mouth daily.      Marland Kitchen. BIOTIN PO Take 1 tablet by mouth daily.      . cholecalciferol (VITAMIN D) 1000 UNITS tablet Take 1,000 Units by mouth daily.      Marland Kitchen. co-enzyme Q-10 30 MG capsule Take 30 mg by mouth daily.      Marland Kitchen. conjugated estrogens (PREMARIN) vaginal cream Place 1  Applicatorful vaginally. Pt is take 1/4 a syringe Twice a week      . glucose blood (ONE TOUCH ULTRA TEST) test strip       . multivitamin (THERAGRAN) per tablet Take 1 tablet by mouth daily.      . Omega-3 Fatty Acids (FISH OIL PO) Take 1 tablet by mouth daily.      Marland Kitchen. omeprazole (PRILOSEC) 40 MG capsule Take 1 capsule (40 mg total) by mouth daily.  30 capsule  11  . SUMAtriptan (IMITREX) 100 MG tablet TAKE 1 TAB BY MOUTH AS NEEDED FOR MIGRAINE  9 tablet  3  . MAGNESIUM CARBONATE PO Take 1 tablet by mouth daily.       No current facility-administered medications for this visit.    Allergies  Review of patient's allergies indicates no known allergies.  Electrocardiogram:  SR rate 85 T wave inversion in lead 3  Assessment and Plan

## 2014-01-27 NOTE — Assessment & Plan Note (Signed)
She is trying to exercise and watch her calories.  Her anxiety and depression centers around her obesity for the last 20 years  Encour[facility] and consider [facility]

## 2014-01-27 NOTE — Assessment & Plan Note (Signed)
S/P pelvic floor reconstruction improved

## 2014-01-27 NOTE — Assessment & Plan Note (Signed)
Benign No structural heart disease.  Feels better off beta blocker

## 2014-03-02 ENCOUNTER — Other Ambulatory Visit: Payer: Self-pay | Admitting: Family Medicine

## 2014-03-02 DIAGNOSIS — R921 Mammographic calcification found on diagnostic imaging of breast: Secondary | ICD-10-CM

## 2014-03-14 ENCOUNTER — Ambulatory Visit
Admission: RE | Admit: 2014-03-14 | Discharge: 2014-03-14 | Disposition: A | Discharge status: Home / Self Care | Payer: Managed Care, Other (non HMO) | Source: Ambulatory Visit | Attending: Family Medicine | Admitting: Family Medicine

## 2014-03-14 DIAGNOSIS — R921 Mammographic calcification found on diagnostic imaging of breast: Secondary | ICD-10-CM

## 2014-04-18 ENCOUNTER — Other Ambulatory Visit: Payer: Self-pay | Admitting: Family Medicine

## 2014-07-03 ENCOUNTER — Other Ambulatory Visit: Payer: Self-pay | Admitting: Family Medicine

## 2014-07-14 ENCOUNTER — Encounter: Payer: Self-pay | Admitting: Family Medicine

## 2014-07-14 ENCOUNTER — Ambulatory Visit (INDEPENDENT_AMBULATORY_CARE_PROVIDER_SITE_OTHER): Payer: Managed Care, Other (non HMO) | Admitting: Family Medicine

## 2014-07-14 VITALS — BP 104/72 | HR 91 | Temp 99.3°F | Ht 63.0 in | Wt 188.0 lb

## 2014-07-14 DIAGNOSIS — M26659 Arthropathy of unspecified temporomandibular joint: Secondary | ICD-10-CM | POA: Insufficient documentation

## 2014-07-14 DIAGNOSIS — M26609 Unspecified temporomandibular joint disorder, unspecified side: Secondary | ICD-10-CM

## 2014-07-14 MED ORDER — SUMATRIPTAN SUCCINATE 100 MG PO TABS: ORAL_TABLET | ORAL | Status: AC

## 2014-07-14 MED ORDER — OMEPRAZOLE 40 MG PO CPDR: DELAYED_RELEASE_CAPSULE | ORAL | Status: AC

## 2014-07-14 NOTE — Progress Notes (Signed)
Pre visit review using our clinic review tool, if applicable. No additional management support is needed unless otherwise documented below in the visit note. 

## 2014-07-14 NOTE — Progress Notes (Signed)
   Subjective:    Patient ID: Jodi Smith, female    DOB: 11/08/1966, 48 y.o.   MRN: [id number]  HPI Here for 2 days of pain in the left jaw. This started suddenly with a "pop" and sharp pain in the left jaw, and she thinks she was yawning at the time. Since then she has had milder pain in the area. Naproxyn helps.    Review of Systems  Constitutional: Negative.   HENT: Negative.        Objective:   Physical Exam  Constitutional: She appears well-developed and well-nourished.  HENT:  Right Ear: External ear normal.  Left Ear: External ear normal.  Nose: Nose normal.  Mouth/Throat: Oropharynx is clear and moist.  The left TMJ is tender and has crepitus, full ROM   Neck: No thyromegaly present.  Lymphadenopathy:    She has no cervical adenopathy.          Assessment & Plan:  Use Naproxen and heat. Avoid chewing gum and taking large bites of food. See your dentist to check your bite alignment.

## 2014-08-11 ENCOUNTER — Other Ambulatory Visit: Payer: Self-pay | Admitting: *Deleted

## 2015-01-24 ENCOUNTER — Encounter: Payer: Self-pay | Admitting: Cardiovascular Disease

## 2015-02-21 ENCOUNTER — Other Ambulatory Visit: Payer: Self-pay | Admitting: Family Medicine

## 2015-02-21 DIAGNOSIS — R921 Mammographic calcification found on diagnostic imaging of breast: Secondary | ICD-10-CM

## 2015-02-28 ENCOUNTER — Ambulatory Visit
Admission: RE | Admit: 2015-02-28 | Discharge: 2015-02-28 | Disposition: A | Discharge status: Home / Self Care | Payer: Managed Care, Other (non HMO) | Source: Ambulatory Visit | Attending: Family Medicine | Admitting: Family Medicine

## 2015-02-28 DIAGNOSIS — R921 Mammographic calcification found on diagnostic imaging of breast: Secondary | ICD-10-CM

## 2015-06-25 ENCOUNTER — Other Ambulatory Visit: Payer: Self-pay

## 2015-09-08 ENCOUNTER — Encounter: Payer: Self-pay | Admitting: Family Medicine

## 2015-09-11 MED ORDER — SUMATRIPTAN SUCCINATE 100 MG PO TABS: 100.0000 mg | ORAL_TABLET | ORAL | Status: AC | PRN

## 2015-09-11 MED ORDER — SUMATRIPTAN SUCCINATE 100 MG PO TABS
100.0000 mg | ORAL_TABLET | ORAL | Status: DC | PRN
Start: 2015-09-11 — End: 2015-09-11

## 2015-09-11 NOTE — Telephone Encounter (Signed)
I printed and script was faxed to below number.

## 2015-09-11 NOTE — Telephone Encounter (Signed)
Call in Sumatriptan 100 mg #10 with 2 rf

## 2016-08-15 ENCOUNTER — Telehealth: Payer: Self-pay | Admitting: Family Medicine

## 2016-08-15 NOTE — Telephone Encounter (Signed)
Pt left a voice message, requesting a referral for diagnostic mammogram.

## 2016-08-19 NOTE — Telephone Encounter (Signed)
I spoke with pt and went over below message.  

## 2016-08-19 NOTE — Telephone Encounter (Signed)
We cannot do this any longer, [place]. She needs to have a PCP there to do this

## 2017-09-17 ENCOUNTER — Encounter: Payer: Self-pay | Admitting: Family Medicine

## 2019-11-11 ENCOUNTER — Encounter: Payer: Self-pay | Admitting: Gastroenterology
